# Patient Record
Sex: Female | Born: 1975
Health system: Southern US, Community
[De-identification: ages and names within clinical notes are randomized; demographics above are authoritative.]

## PROBLEM LIST (undated history)

## (undated) DIAGNOSIS — N760 Acute vaginitis: Secondary | ICD-10-CM

## (undated) DIAGNOSIS — B379 Candidiasis, unspecified: Secondary | ICD-10-CM

## (undated) DIAGNOSIS — J984 Other disorders of lung: Secondary | ICD-10-CM

## (undated) DIAGNOSIS — K219 Gastro-esophageal reflux disease without esophagitis: Secondary | ICD-10-CM

## (undated) DIAGNOSIS — J45909 Unspecified asthma, uncomplicated: Secondary | ICD-10-CM

## (undated) DIAGNOSIS — B9689 Other specified bacterial agents as the cause of diseases classified elsewhere: Secondary | ICD-10-CM

---

## 2000-02-16 ENCOUNTER — Inpatient Hospital Stay (HOSPITAL_COMMUNITY): Admission: EM | Admit: 2000-02-16 | Discharge: 2000-02-20 | Payer: Self-pay | Admitting: Emergency Medicine

## 2000-02-16 ENCOUNTER — Encounter: Payer: Self-pay | Admitting: Internal Medicine

## 2000-02-17 ENCOUNTER — Encounter: Payer: Self-pay | Admitting: Internal Medicine

## 2001-06-21 ENCOUNTER — Other Ambulatory Visit: Admission: RE | Admit: 2001-06-21 | Discharge: 2001-06-21 | Payer: Self-pay

## 2002-06-12 ENCOUNTER — Encounter: Admission: RE | Admit: 2002-06-12 | Discharge: 2002-06-12 | Payer: Self-pay | Admitting: *Deleted

## 2002-06-12 ENCOUNTER — Encounter: Payer: Self-pay | Admitting: Allergy and Immunology

## 2002-07-28 ENCOUNTER — Inpatient Hospital Stay (HOSPITAL_COMMUNITY): Admission: EM | Admit: 2002-07-28 | Discharge: 2002-08-06 | Payer: Self-pay | Admitting: *Deleted

## 2002-07-28 ENCOUNTER — Encounter: Payer: Self-pay | Admitting: Emergency Medicine

## 2002-07-29 ENCOUNTER — Encounter: Payer: Self-pay | Admitting: Internal Medicine

## 2002-07-30 ENCOUNTER — Encounter: Payer: Self-pay | Admitting: Internal Medicine

## 2002-07-31 ENCOUNTER — Encounter: Payer: Self-pay | Admitting: Internal Medicine

## 2002-08-01 ENCOUNTER — Encounter: Payer: Self-pay | Admitting: Internal Medicine

## 2002-08-02 ENCOUNTER — Encounter: Payer: Self-pay | Admitting: Internal Medicine

## 2002-08-03 ENCOUNTER — Encounter: Payer: Self-pay | Admitting: Internal Medicine

## 2002-08-04 ENCOUNTER — Encounter: Payer: Self-pay | Admitting: Internal Medicine

## 2002-08-05 ENCOUNTER — Encounter: Payer: Self-pay | Admitting: Internal Medicine

## 2002-08-13 ENCOUNTER — Ambulatory Visit (HOSPITAL_COMMUNITY): Admission: RE | Admit: 2002-08-13 | Discharge: 2002-08-13 | Payer: Self-pay | Admitting: Family Medicine

## 2002-08-13 ENCOUNTER — Encounter: Payer: Self-pay | Admitting: Family Medicine

## 2002-09-02 ENCOUNTER — Encounter: Admission: RE | Admit: 2002-09-02 | Discharge: 2002-09-02 | Payer: Self-pay | Admitting: Sports Medicine

## 2002-10-18 ENCOUNTER — Emergency Department (HOSPITAL_COMMUNITY): Admission: EM | Admit: 2002-10-18 | Discharge: 2002-10-18 | Payer: Self-pay | Admitting: Emergency Medicine

## 2002-10-18 ENCOUNTER — Encounter: Payer: Self-pay | Admitting: Emergency Medicine

## 2002-12-07 ENCOUNTER — Encounter (INDEPENDENT_AMBULATORY_CARE_PROVIDER_SITE_OTHER): Payer: Self-pay | Admitting: *Deleted

## 2002-12-07 LAB — CONVERTED CEMR LAB

## 2002-12-26 ENCOUNTER — Encounter: Admission: RE | Admit: 2002-12-26 | Discharge: 2002-12-26 | Payer: Self-pay | Admitting: Sports Medicine

## 2003-01-22 ENCOUNTER — Encounter: Admission: RE | Admit: 2003-01-22 | Discharge: 2003-01-22 | Payer: Self-pay | Admitting: Family Medicine

## 2003-08-24 ENCOUNTER — Emergency Department (HOSPITAL_COMMUNITY): Admission: EM | Admit: 2003-08-24 | Discharge: 2003-08-24 | Payer: Self-pay | Admitting: Family Medicine

## 2003-10-02 ENCOUNTER — Emergency Department (HOSPITAL_COMMUNITY): Admission: EM | Admit: 2003-10-02 | Discharge: 2003-10-02 | Payer: Self-pay | Admitting: Emergency Medicine

## 2003-10-19 ENCOUNTER — Emergency Department (HOSPITAL_COMMUNITY): Admission: EM | Admit: 2003-10-19 | Discharge: 2003-10-19 | Payer: Self-pay | Admitting: Family Medicine

## 2003-12-18 ENCOUNTER — Encounter: Admission: RE | Admit: 2003-12-18 | Discharge: 2003-12-18 | Payer: Self-pay | Admitting: Family Medicine

## 2004-01-08 ENCOUNTER — Ambulatory Visit: Payer: Self-pay | Admitting: Family Medicine

## 2004-02-19 ENCOUNTER — Ambulatory Visit: Payer: Self-pay | Admitting: Family Medicine

## 2004-03-17 ENCOUNTER — Ambulatory Visit: Payer: Self-pay | Admitting: Obstetrics and Gynecology

## 2006-03-30 ENCOUNTER — Emergency Department (HOSPITAL_COMMUNITY): Admission: EM | Admit: 2006-03-30 | Discharge: 2006-03-30 | Payer: Self-pay | Admitting: Emergency Medicine

## 2006-07-05 DIAGNOSIS — J45909 Unspecified asthma, uncomplicated: Secondary | ICD-10-CM | POA: Insufficient documentation

## 2006-07-05 DIAGNOSIS — K219 Gastro-esophageal reflux disease without esophagitis: Secondary | ICD-10-CM | POA: Insufficient documentation

## 2006-07-05 DIAGNOSIS — D509 Iron deficiency anemia, unspecified: Secondary | ICD-10-CM | POA: Insufficient documentation

## 2006-07-06 ENCOUNTER — Encounter (INDEPENDENT_AMBULATORY_CARE_PROVIDER_SITE_OTHER): Payer: Self-pay | Admitting: *Deleted

## 2006-07-30 ENCOUNTER — Emergency Department (HOSPITAL_COMMUNITY): Admission: EM | Admit: 2006-07-30 | Discharge: 2006-07-30 | Payer: Self-pay | Admitting: Family Medicine

## 2014-02-18 ENCOUNTER — Encounter (HOSPITAL_COMMUNITY): Payer: Self-pay | Admitting: Emergency Medicine

## 2014-02-18 ENCOUNTER — Emergency Department (INDEPENDENT_AMBULATORY_CARE_PROVIDER_SITE_OTHER)
Admission: EM | Admit: 2014-02-18 | Discharge: 2014-02-18 | Disposition: A | Discharge status: Home / Self Care | Payer: Medicare PPO | Source: Home / Self Care

## 2014-02-18 DIAGNOSIS — J309 Allergic rhinitis, unspecified: Secondary | ICD-10-CM

## 2014-02-18 DIAGNOSIS — K219 Gastro-esophageal reflux disease without esophagitis: Secondary | ICD-10-CM

## 2014-02-18 DIAGNOSIS — R05 Cough: Secondary | ICD-10-CM

## 2014-02-18 DIAGNOSIS — J3089 Other allergic rhinitis: Secondary | ICD-10-CM

## 2014-02-18 DIAGNOSIS — R059 Cough, unspecified: Secondary | ICD-10-CM

## 2014-02-18 DIAGNOSIS — J9809 Other diseases of bronchus, not elsewhere classified: Secondary | ICD-10-CM

## 2014-02-18 MED ORDER — SUCRALFATE 1 GM/10ML PO SUSP: ORAL | Status: DC

## 2014-02-18 NOTE — ED Provider Notes (Signed)
CSN: [id number]     Arrival date & time 02/18/14  1543 History   First MD Initiated Contact with Patient 02/18/14 1626     Chief Complaint  Patient presents with  . Sinusitis  . Gastrophageal Reflux   (Consider location/radiation/quality/duration/timing/severity/associated sxs/prior Treatment) HPI Comments: 38 year old female states that last p.m. she suffered an exacerbation of her esophageal reflux. She experienced has had reflux seem to the throat associated with local burning in the oral pharynx and esophagus. It is worse with a supine position. She has been sleeping in name Graciela HusbandsKlein position. She has been taking omeprazole 20 mg has a prescription for Nexium at that pharmacy. She has never seen a gastroenterologist.  A second complaint is that of sinus congestion with facial and forehead pressure. This began approximately a week ago. She is not taking medications for this.   History reviewed. No pertinent past medical history. History reviewed. No pertinent past surgical history. No family history on file. History  Substance Use Topics  . Smoking status: Never Smoker   . Smokeless tobacco: Not on file  . Alcohol Use: No   OB History   Grav Para Term Preterm Abortions TAB SAB Ect Mult Living                 Review of Systems  Constitutional: Negative for fever, activity change and fatigue.  HENT: Positive for congestion and postnasal drip. Negative for ear pain, sore throat and trouble swallowing.   Respiratory: Positive for cough. Negative for shortness of breath and wheezing.   Cardiovascular: Negative for chest pain.  Gastrointestinal: Negative for nausea, vomiting and abdominal pain.       As per history of present illness  Genitourinary: Negative.   Musculoskeletal: Negative for back pain and myalgias.  Skin: Negative for pallor and rash.  Neurological: Positive for headaches. Negative for tremors, syncope, speech difficulty and numbness.  Psychiatric/Behavioral:  Negative.     Allergies  Aspirin and Morphine and related  Home Medications   Prior to Admission medications   Medication Sig Start Date End Date Taking? Authorizing Provider  albuterol (PROVENTIL HFA;VENTOLIN HFA) 108 (90 BASE) MCG/ACT inhaler Inhale into the lungs every 6 (six) hours as needed for wheezing or shortness of breath.   Yes Historical Provider, MD  budesonide-formoterol (SYMBICORT) 160-4.5 MCG/ACT inhaler Inhale 2 puffs into the lungs 2 (two) times daily.   Yes Historical Provider, MD  esomeprazole (NEXIUM) 20 MG capsule Take 20 mg by mouth daily at 12 noon.   Yes Historical Provider, MD  sucralfate (CARAFATE) 1 GM/10ML suspension 10 mL before meals and before bedtime prn burning and reflux 02/18/14   Hayden Rasmussenavid Marvelle Span, NP   BP 112/71  Pulse 72  Temp(Src) 98.5 F (36.9 C) (Oral)  Resp 18  SpO2 99%  LMP 02/18/2014 Physical Exam  Nursing note and vitals reviewed. Constitutional: She is oriented to person, place, and time. She appears well-developed and well-nourished. No distress.  HENT:  Mouth/Throat: No oropharyngeal exudate.  TM's normal OP with minor erythema and clear PND  Eyes: Conjunctivae and EOM are normal.  Neck: Normal range of motion. Neck supple.  Cardiovascular: Normal rate, regular rhythm and normal heart sounds.   Pulmonary/Chest: Effort normal. She has wheezes. She has no rales.  Musculoskeletal: Normal range of motion. She exhibits no edema.  Lymphadenopathy:    She has no cervical adenopathy.  Neurological: She is alert and oriented to person, place, and time. She exhibits normal muscle tone.  Skin: Skin is warm  and dry.  Psychiatric: She has a normal mood and affect.    ED Course  Procedures (including critical care time) Labs Review Labs Reviewed - No data to display  Imaging Review No results found.   MDM   1. Gastroesophageal reflux disease, esophagitis presence not specified   2. Cough   3. Recurrent bronchospasm   4. Allergic  sinusitis   5. Other allergic rhinitis     Contributions to cough include GERD, bronchospasm and PND Zyrtec or allegra, Flonase and saline nasal spray Use your albuterol HFA again for wheeze Start Nexium total 40 mg a day and Pepcid 20 mg before HS Carafate as directed F/U with PCP, may need to see GI and have upper endo.     Hayden Rasmussenavid Valincia Touch, NP 02/18/14 [phone]

## 2014-02-18 NOTE — Discharge Instructions (Signed)
Allergic Rhinitis Flonase nasal spray Lots of saline nasal spray and frequently Sudafed PE 10 mg for sinus congestion  Allergic rhinitis is when the mucous membranes in the nose respond to allergens. Allergens are particles in the air that cause your body to have an allergic reaction. This causes you to release allergic antibodies. Through a chain of events, these eventually cause you to release histamine into the blood stream. Although meant to protect the body, it is this release of histamine that causes your discomfort, such as frequent sneezing, congestion, and an itchy, runny nose.  CAUSES  Seasonal allergic rhinitis (hay fever) is caused by pollen allergens that may come from grasses, trees, and weeds. Year-round allergic rhinitis (perennial allergic rhinitis) is caused by allergens such as house dust mites, pet dander, and mold spores.  SYMPTOMS   Nasal stuffiness (congestion).  Itchy, runny nose with sneezing and tearing of the eyes. DIAGNOSIS  Your health care provider can help you determine the allergen or allergens that trigger your symptoms. If you and your health care provider are unable to determine the allergen, skin or blood testing may be used. TREATMENT  Allergic rhinitis does not have a cure, but it can be controlled by:  Medicines and allergy shots (immunotherapy).  Avoiding the allergen. Hay fever may often be treated with antihistamines in pill or nasal spray forms. Antihistamines block the effects of histamine. There are over-the-counter medicines that may help with nasal congestion and swelling around the eyes. Check with your health care provider before taking or giving this medicine.  If avoiding the allergen or the medicine prescribed do not work, there are many new medicines your health care provider can prescribe. Stronger medicine may be used if initial measures are ineffective. Desensitizing injections can be used if medicine and avoidance does not work.  Desensitization is when a patient is given ongoing shots until the body becomes less sensitive to the allergen. Make sure you follow up with your health care provider if problems continue. HOME CARE INSTRUCTIONS It is not possible to completely avoid allergens, but you can reduce your symptoms by taking steps to limit your exposure to them. It helps to know exactly what you are allergic to so that you can avoid your specific triggers. SEEK MEDICAL CARE IF:   You have a fever.  You develop a cough that does not stop easily (persistent).  You have shortness of breath.  You start wheezing.  Symptoms interfere with normal daily activities. Document Released: 01/17/2001 Document Revised: 04/29/2013 Document Reviewed: 12/30/2012 [facility] Patient Information 2015 [place], [place]. This information is not intended to replace advice given to you by your health care provider. Make sure you discuss any questions you have with your health care provider.  Barrett's Esophagus Barrett's esophagus occurs when the lining of the esophagus is damaged. The esophagus is the tube that carries food from the mouth to the stomach. With Barrett's esophagus, the lining of the esophagus gets replaced by material that is similar to the lining in the intestines. This process is called intestinal metaplasia. A small number of people with Barrett's esophagus develop esophageal cancer. CAUSES  The exact cause of Barrett's esophagus is unknown. SYMPTOMS  Most people with Barrett's esophagus do not have symptoms. However, many patients also have gastroesophageal reflux disease (GERD). GERD can cause heartburn, trouble swallowing, and a dry cough. DIAGNOSIS Barrett's esophagus is diagnosed by an exam called upper gastrointestinal endoscopy. A thin, flexible tube (endoscope) is passed down the esophagus. The endoscope has  a light and camera on the end. Your caregiver uses the endoscope to view the inside of the esophagus. A  tissue sample may also be taken and examined under a microscope (biopsy). If cancer cells are found during the biopsy, this condition is called dysplasia. TREATMENT  If you have no dysplasia or low-grade dysplasia, your caregiver may recommend no treatment or only taking medicines to treat GERD. Sometimes, taking acid-blocking drugs to treat GERD helps improve the tissue affected by Barrett's esophagus. Your caregiver may also recommend periodic esophageal exams. If you have high-grade dysplasia, treatment may include removing the damaged parts of the esophagus. This can be done by heating, freezing, or surgically removing the tissue. In some cases, surgery may be done to remove most of the esophagus. The stomach is then attached to the remaining portion of the esophagus. HOME CARE INSTRUCTIONS  Take acid-blocking drugs for GERD if recommended by your caregiver.  Keep all follow-up appointments as directed by your caregiver. You may need periodic esophageal exams. SEEK IMMEDIATE MEDICAL CARE IF:  You have chest pain.  You have trouble swallowing.  You vomit blood or material that looks like coffee grounds.  Your stools are bright red or dark. Document Released: 07/15/2003 Document Revised: 10/24/2011 Document Reviewed: 07/04/2011 [facility] Patient Information 2015 [place], [place]. This information is not intended to replace advice given to you by your health care provider. Make sure you discuss any questions you have with your health care provider.  Gastroesophageal Reflux Disease, Adult Nexium 40 mg a day Add Pecid 20 mg 1 hour before bedtime Carafate as directed t coat stomach Gastroesophageal reflux disease (GERD) happens when acid from your stomach flows up into the esophagus. When acid comes in contact with the esophagus, the acid causes soreness (inflammation) in the esophagus. Over time, GERD may create small holes (ulcers) in the lining of the esophagus. CAUSES   Increased body  weight. This puts pressure on the stomach, making acid rise from the stomach into the esophagus.  Smoking. This increases acid production in the stomach.  Drinking alcohol. This causes decreased pressure in the lower esophageal sphincter (valve or ring of muscle between the esophagus and stomach), allowing acid from the stomach into the esophagus.  Late evening meals and a full stomach. This increases pressure and acid production in the stomach.  A malformed lower esophageal sphincter. Sometimes, no cause is found. SYMPTOMS   Burning pain in the lower part of the mid-chest behind the breastbone and in the mid-stomach area. This may occur twice a week or more often.  Trouble swallowing.  Sore throat.  Dry cough.  Asthma-like symptoms including chest tightness, shortness of breath, or wheezing. DIAGNOSIS  Your caregiver may be able to diagnose GERD based on your symptoms. In some cases, X-rays and other tests may be done to check for complications or to check the condition of your stomach and esophagus. TREATMENT  Your caregiver may recommend over-the-counter or prescription medicines to help decrease acid production. Ask your caregiver before starting or adding any new medicines.  HOME CARE INSTRUCTIONS   Change the factors that you can control. Ask your caregiver for guidance concerning weight loss, quitting smoking, and alcohol consumption.  Avoid foods and drinks that make your symptoms worse, such as:  Caffeine or alcoholic drinks.  Chocolate.  Peppermint or mint flavorings.  Garlic and onions.  Spicy foods.  Citrus fruits, such as oranges, lemons, or limes.  Tomato-based foods such as sauce, chili, salsa, and pizza.  Foy [place]  and fatty foods.  Avoid lying down for the 3 hours prior to your bedtime or prior to taking a nap.  Eat small, frequent meals instead of large meals.  Wear loose-fitting clothing. Do not wear anything tight around your waist that causes  pressure on your stomach.  Raise the head of your bed 6 to 8 inches with wood blocks to help you sleep. Extra pillows will not help.  Only take over-the-counter or prescription medicines for pain, discomfort, or fever as directed by your caregiver.  Do not take aspirin, ibuprofen, or other nonsteroidal anti-inflammatory drugs (NSAIDs). SEEK IMMEDIATE MEDICAL CARE IF:   You have pain in your arms, neck, jaw, teeth, or back.  Your pain increases or changes in intensity or duration.  You develop nausea, vomiting, or sweating (diaphoresis).  You develop shortness of breath, or you faint.  Your vomit is green, yellow, black, or looks like coffee grounds or blood.  Your stool is red, bloody, or black. These symptoms could be signs of other problems, such as heart disease, gastric bleeding, or esophageal bleeding. MAKE SURE YOU:   Understand these instructions.  Will watch your condition.  Will get help right away if you are not doing well or get worse. Document Released: 02/01/2005 Document Revised: 07/17/2011 Document Reviewed: 11/11/2010 [facility] Patient Information 2015 [place], [place]. This information is not intended to replace advice given to you by your health care provider. Make sure you discuss any questions you have with your health care provider.  Food Choices for Gastroesophageal Reflux Disease When you have gastroesophageal reflux disease (GERD), the foods you eat and your eating habits are very important. Choosing the right foods can help ease the discomfort of GERD. WHAT GENERAL GUIDELINES DO I NEED TO FOLLOW?  Choose fruits, vegetables, whole grains, low-fat dairy products, and low-fat meat, fish, and poultry.  Limit fats such as oils, salad dressings, butter, nuts, and avocado.  Keep a food diary to identify foods that cause symptoms.  Avoid foods that cause reflux. These may be different for different people.  Eat frequent small meals instead of three large  meals each day.  Eat your meals slowly, in a relaxed setting.  Limit fried foods.  Cook foods using methods other than frying.  Avoid drinking alcohol.  Avoid drinking large amounts of liquids with your meals.  Avoid bending over or lying down until 2-3 hours after eating. WHAT FOODS ARE NOT RECOMMENDED? The following are some foods and drinks that may worsen your symptoms: Vegetables Tomatoes. Tomato juice. Tomato and spaghetti sauce. Chili peppers. Onion and garlic. Horseradish. Fruits Oranges, grapefruit, and lemon (fruit and juice). Meats High-fat meats, fish, and poultry. This includes hot dogs, ribs, ham, sausage, salami, and bacon. Dairy Whole milk and chocolate milk. Sour cream. Cream. Butter. Ice cream. Cream cheese.  Beverages Coffee and tea, with or without caffeine. Carbonated beverages or energy drinks. Condiments Hot sauce. Barbecue sauce.  Sweets/Desserts Chocolate and cocoa. Donuts. Peppermint and spearmint. Fats and Oils High-fat foods, including [place] fries and potato chips. Other Vinegar. Strong spices, such as black pepper, white pepper, red pepper, cayenne, curry powder, cloves, ginger, and chili powder. The items listed above may not be a complete list of foods and beverages to avoid. Contact your dietitian for more information. Document Released: 04/24/2005 Document Revised: 04/29/2013 Document Reviewed: 02/26/2013 [facility] Patient Information 2015 [place], [place]. This information is not intended to replace advice given to you by your health care provider. Make sure you discuss any questions you  have with your health care provider.  Sinusitis Sinusitis is redness, soreness, and puffiness (inflammation) of the air pockets in the bones of your face (sinuses). The redness, soreness, and puffiness can cause air and mucus to get trapped in your sinuses. This can allow germs to grow and cause an infection.  HOME CARE   Drink enough fluids to keep your pee  (urine) clear or pale yellow.  Use a humidifier in your home.  Run a hot shower to create steam in the bathroom. Sit in the bathroom with the door closed. Breathe in the steam 3-4 times a day.  Put a warm, moist washcloth on your face 3-4 times a day, or as told by your doctor.  Use salt water sprays (saline sprays) to wet the thick fluid in your nose. This can help the sinuses drain.  Only take medicine as told by your doctor. GET HELP RIGHT AWAY IF:   Your pain gets worse.  You have very bad headaches.  You are sick to your stomach (nauseous).  You throw up (vomit).  You are very sleepy (drowsy) all the time.  Your face is puffy (swollen).  Your vision changes.  You have a stiff neck.  You have trouble breathing. MAKE SURE YOU:   Understand these instructions.  Will watch your condition.  Will get help right away if you are not doing well or get worse. Document Released: 10/11/2007 Document Revised: 01/17/2012 Document Reviewed: 11/28/2011 [facility] Patient Information 2015 [place], [place]. This information is not intended to replace advice given to you by your health care provider. Make sure you discuss any questions you have with your health care provider.

## 2014-02-18 NOTE — ED Provider Notes (Signed)
Medical screening examination/treatment/procedure(s) were performed by non-physician practitioner and as supervising physician I was immediately available for consultation/collaboration.  Leslee Homeavid Tanmay Halteman, M.D.  Reuben Likesavid C Melida Northington, MD 02/18/14 [phone]

## 2014-02-18 NOTE — ED Notes (Signed)
C/o sinus sx onset 1 week Sx include: facial pressure, PND, runny nose, HA Denies f/v/n/d Also c/o "extreme indigestion" onset 2 days Taking omeprazole w/no relief Alert, no signs of acute distress.

## 2014-10-14 ENCOUNTER — Other Ambulatory Visit: Payer: Self-pay | Admitting: Obstetrics and Gynecology

## 2014-10-15 LAB — CYTOLOGY - PAP

## 2014-12-11 DIAGNOSIS — Q336 Congenital hypoplasia and dysplasia of lung: Secondary | ICD-10-CM

## 2014-12-11 DIAGNOSIS — Q2579 Other congenital malformations of pulmonary artery: Secondary | ICD-10-CM

## 2015-06-25 DIAGNOSIS — J45909 Unspecified asthma, uncomplicated: Secondary | ICD-10-CM | POA: Diagnosis not present

## 2015-06-25 DIAGNOSIS — Z6841 Body Mass Index (BMI) 40.0 and over, adult: Secondary | ICD-10-CM | POA: Diagnosis not present

## 2015-06-25 DIAGNOSIS — J4551 Severe persistent asthma with (acute) exacerbation: Secondary | ICD-10-CM | POA: Diagnosis not present

## 2015-06-25 DIAGNOSIS — K219 Gastro-esophageal reflux disease without esophagitis: Secondary | ICD-10-CM | POA: Diagnosis not present

## 2015-06-25 DIAGNOSIS — J449 Chronic obstructive pulmonary disease, unspecified: Secondary | ICD-10-CM | POA: Diagnosis not present

## 2015-07-23 DIAGNOSIS — R69 Illness, unspecified: Secondary | ICD-10-CM | POA: Diagnosis not present

## 2015-07-26 DIAGNOSIS — R69 Illness, unspecified: Secondary | ICD-10-CM | POA: Diagnosis not present

## 2015-09-21 DIAGNOSIS — H5210 Myopia, unspecified eye: Secondary | ICD-10-CM | POA: Diagnosis not present

## 2015-11-10 DIAGNOSIS — R69 Illness, unspecified: Secondary | ICD-10-CM | POA: Diagnosis not present

## 2015-12-09 DIAGNOSIS — R69 Illness, unspecified: Secondary | ICD-10-CM | POA: Diagnosis not present

## 2015-12-31 DIAGNOSIS — E65 Localized adiposity: Secondary | ICD-10-CM | POA: Diagnosis not present

## 2015-12-31 DIAGNOSIS — J301 Allergic rhinitis due to pollen: Secondary | ICD-10-CM | POA: Diagnosis not present

## 2015-12-31 DIAGNOSIS — Q333 Agenesis of lung: Secondary | ICD-10-CM | POA: Diagnosis not present

## 2015-12-31 DIAGNOSIS — J449 Chronic obstructive pulmonary disease, unspecified: Secondary | ICD-10-CM | POA: Diagnosis not present

## 2015-12-31 DIAGNOSIS — J4550 Severe persistent asthma, uncomplicated: Secondary | ICD-10-CM | POA: Diagnosis not present

## 2016-05-03 DIAGNOSIS — J4551 Severe persistent asthma with (acute) exacerbation: Secondary | ICD-10-CM | POA: Diagnosis not present

## 2016-05-31 ENCOUNTER — Encounter (HOSPITAL_COMMUNITY): Payer: Self-pay | Admitting: Emergency Medicine

## 2016-05-31 ENCOUNTER — Ambulatory Visit (HOSPITAL_COMMUNITY)
Admission: EM | Admit: 2016-05-31 | Discharge: 2016-05-31 | Disposition: A | Discharge status: Home / Self Care | Payer: Medicare HMO | Attending: Emergency Medicine | Admitting: Emergency Medicine

## 2016-05-31 DIAGNOSIS — R0602 Shortness of breath: Secondary | ICD-10-CM | POA: Diagnosis not present

## 2016-05-31 DIAGNOSIS — J4541 Moderate persistent asthma with (acute) exacerbation: Secondary | ICD-10-CM | POA: Diagnosis not present

## 2016-05-31 DIAGNOSIS — J40 Bronchitis, not specified as acute or chronic: Secondary | ICD-10-CM | POA: Diagnosis not present

## 2016-05-31 HISTORY — DX: Unspecified asthma, uncomplicated: J45.909

## 2016-05-31 HISTORY — DX: Gastro-esophageal reflux disease without esophagitis: K21.9

## 2016-05-31 HISTORY — DX: Other disorders of lung: J98.4

## 2016-05-31 MED ORDER — ALBUTEROL SULFATE HFA 108 (90 BASE) MCG/ACT IN AERS
1.0000 | INHALATION_SPRAY | Freq: Four times a day (QID) | RESPIRATORY_TRACT | 0 refills | Status: DC | PRN
Start: 2016-05-31 — End: 2017-02-26

## 2016-05-31 MED ORDER — ALBUTEROL SULFATE (2.5 MG/3ML) 0.083% IN NEBU
2.5000 mg | INHALATION_SOLUTION | Freq: Once | RESPIRATORY_TRACT | Status: AC
  Administered 2016-05-31: 2.5 mg via RESPIRATORY_TRACT

## 2016-05-31 MED ORDER — BENZONATATE 100 MG PO CAPS: 200.0000 mg | ORAL_CAPSULE | Freq: Three times a day (TID) | ORAL | 0 refills | Status: DC | PRN

## 2016-05-31 MED ORDER — ALBUTEROL SULFATE (2.5 MG/3ML) 0.083% IN NEBU
INHALATION_SOLUTION | RESPIRATORY_TRACT | Status: AC
  Filled 2016-05-31: qty 3

## 2016-05-31 MED ORDER — AZITHROMYCIN 250 MG PO TABS: 250.0000 mg | ORAL_TABLET | Freq: Every day | ORAL | 0 refills | Status: DC

## 2016-05-31 MED ORDER — PREDNISONE 10 MG (21) PO TBPK: 10.0000 mg | ORAL_TABLET | Freq: Every day | ORAL | 0 refills | Status: DC

## 2016-05-31 NOTE — ED Provider Notes (Signed)
CSN: [id number]     Arrival date & time 05/31/16  1058 History   First MD Initiated Contact with Patient 05/31/16 1130     Chief Complaint  Patient presents with  . Shortness of Breath   (Consider location/radiation/quality/duration/timing/severity/associated sxs/prior Treatment) Patient c/o SOB, sinus congestion, coughing and fever.  Patient c/o sx's for 4 days.   The history is provided by the patient.  Shortness of Breath  Severity:  Moderate Onset quality:  Sudden Duration:  4 days Timing:  Constant Progression:  Worsening Chronicity:  New Context: activity, URI and weather changes   Relieved by:  Inhaler Worsened by:  Activity Ineffective treatments:  Rest Associated symptoms: wheezing     Past Medical History:  Diagnosis Date  . Asthma   . GERD (gastroesophageal reflux disease)   . Lung abnormality    "right lung underdeveloped"   History reviewed. No pertinent surgical history. No family history on file. Social History  Substance Use Topics  . Smoking status: Never Smoker  . Smokeless tobacco: Not on file  . Alcohol use No   OB History    No data available     Review of Systems  Constitutional: Positive for fatigue.  HENT: Positive for rhinorrhea.   Eyes: Negative.   Respiratory: Positive for shortness of breath and wheezing.   Cardiovascular: Negative.   Gastrointestinal: Negative.   Endocrine: Negative.   Genitourinary: Negative.   Musculoskeletal: Negative.   Skin: Negative.   Allergic/Immunologic: Negative.   Neurological: Negative.   Hematological: Negative.   Psychiatric/Behavioral: Negative.     Allergies  Aspirin and Morphine and related  Home Medications   Prior to Admission medications   Medication Sig Start Date End Date Taking? Authorizing Provider  acetaminophen (TYLENOL) 325 MG tablet Take 650 mg by mouth every 6 (six) hours as needed.   Yes Historical Provider, MD  albuterol (PROVENTIL HFA;VENTOLIN HFA) 108 (90 BASE) MCG/ACT  inhaler Inhale into the lungs every 6 (six) hours as needed for wheezing or shortness of breath.    Historical Provider, MD  albuterol (PROVENTIL HFA;VENTOLIN HFA) 108 (90 Base) MCG/ACT inhaler Inhale 1-2 puffs into the lungs every 6 (six) hours as needed for wheezing or shortness of breath. 05/31/16   Deatra CanterWilliam J Dally Oshel, FNP  azithromycin (ZITHROMAX) 250 MG tablet Take 1 tablet (250 mg total) by mouth daily. Take first 2 tablets together, then 1 every day until finished. 05/31/16   Deatra CanterWilliam J Chima Astorino, FNP  benzonatate (TESSALON) 100 MG capsule Take 2 capsules (200 mg total) by mouth 3 (three) times daily as needed for cough. 05/31/16   Deatra CanterWilliam J Ayari Liwanag, FNP  budesonide-formoterol (SYMBICORT) 160-4.5 MCG/ACT inhaler Inhale 2 puffs into the lungs 2 (two) times daily.    Historical Provider, MD  esomeprazole (NEXIUM) 20 MG capsule Take 20 mg by mouth daily at 12 noon.    Historical Provider, MD  predniSONE (STERAPRED UNI-PAK 21 TAB) 10 MG (21) TBPK tablet Take 1 tablet (10 mg total) by mouth daily. Take 4 po qd x2d then 3po qd x 2d then 2po qd x 2d then 1po qd x 2d then stop 05/31/16   Deatra CanterWilliam J Jaclyn Andy, FNP  sucralfate (CARAFATE) 1 GM/10ML suspension 10 mL before meals and before bedtime prn burning and reflux 02/18/14   Hayden Rasmussenavid Mabe, NP   Meds Ordered and Administered this Visit   Medications  albuterol (PROVENTIL) (2.5 MG/3ML) 0.083% nebulizer solution 2.5 mg (2.5 mg Nebulization Given 05/31/16 1143)    BP 116/81 (BP Location: Right  Arm) Comment (BP Location): large cuff  Pulse 103   Temp 98.4 F (36.9 C) (Oral)   Resp 22   LMP 04/12/2016   SpO2 97%  No data found.   Physical Exam  Constitutional: She is oriented to person, place, and time. She appears well-developed and well-nourished.  HENT:  Head: Normocephalic and atraumatic.  Right Ear: External ear normal.  Left Ear: External ear normal.  Mouth/Throat: Oropharynx is clear and moist.  Eyes: Conjunctivae and EOM are normal. Pupils are equal,  round, and reactive to light.  Neck: Normal range of motion. Neck supple.  Cardiovascular: Normal rate, regular rhythm and normal heart sounds.   Pulmonary/Chest: Effort normal. She has wheezes.  Abdominal: Soft. Bowel sounds are normal.  Musculoskeletal: Normal range of motion.  Neurological: She is alert and oriented to person, place, and time.  Nursing note and vitals reviewed.   Urgent Care Course     Procedures (including critical care time)  Labs Review Labs Reviewed - No data to display  Imaging Review No results found.   Visual Acuity Review  Right Eye Distance:   Left Eye Distance:   Bilateral Distance:    Right Eye Near:   Left Eye Near:    Bilateral Near:         MDM   1. Moderate persistent asthma with exacerbation   2. Bronchitis   3. SOB (shortness of breath)    Neb Treatment Zpak Prednisone taper 10mg  4x2 3x2 2x2 1x2 then stop#20 Albuterol MDI   Push po fluids, rest, tylenol and motrin otc prn as directed for fever, arthralgias, and myalgias.  Follow up prn if sx's continue or persist.    Deatra Canter, FNP 05/31/16 1201

## 2016-05-31 NOTE — ED Triage Notes (Signed)
Sob and sinus congestion, runny nose, coughing.  Coughing up dark yellow phlegm.  Sob with activity and sob with coughing spells.  Unsure if fever.  Having chills and hot spells and feeling very tired

## 2016-06-01 DIAGNOSIS — R69 Illness, unspecified: Secondary | ICD-10-CM | POA: Diagnosis not present

## 2016-06-03 ENCOUNTER — Encounter (HOSPITAL_COMMUNITY): Payer: Self-pay | Admitting: Emergency Medicine

## 2016-06-03 ENCOUNTER — Emergency Department (HOSPITAL_COMMUNITY)
Admission: EM | Admit: 2016-06-03 | Discharge: 2016-06-03 | Disposition: A | Discharge status: Home / Self Care | Payer: Medicare HMO | Attending: Emergency Medicine | Admitting: Emergency Medicine

## 2016-06-03 ENCOUNTER — Emergency Department (HOSPITAL_COMMUNITY): Payer: Medicare HMO

## 2016-06-03 DIAGNOSIS — J988 Other specified respiratory disorders: Secondary | ICD-10-CM | POA: Insufficient documentation

## 2016-06-03 DIAGNOSIS — Z79899 Other long term (current) drug therapy: Secondary | ICD-10-CM | POA: Diagnosis not present

## 2016-06-03 DIAGNOSIS — R0602 Shortness of breath: Secondary | ICD-10-CM | POA: Diagnosis not present

## 2016-06-03 DIAGNOSIS — J069 Acute upper respiratory infection, unspecified: Secondary | ICD-10-CM | POA: Diagnosis not present

## 2016-06-03 DIAGNOSIS — R69 Illness, unspecified: Secondary | ICD-10-CM | POA: Diagnosis not present

## 2016-06-03 DIAGNOSIS — J45909 Unspecified asthma, uncomplicated: Secondary | ICD-10-CM | POA: Insufficient documentation

## 2016-06-03 DIAGNOSIS — R05 Cough: Secondary | ICD-10-CM | POA: Diagnosis not present

## 2016-06-03 LAB — TROPONIN I: Troponin I: <0.03 ng/mL (ref <0.03)

## 2016-06-03 LAB — BASIC METABOLIC PANEL
Anion gap: 11 (ref 5–15)
BUN: 5 mg/dL — ABNORMAL LOW (ref 6–20)
CHLORIDE: 104 mmol/L (ref 101–111)
CO2: 25 mmol/L (ref 22–32)
Calcium: 9.3 mg/dL (ref 8.9–10.3)
Creatinine, Ser: 0.81 mg/dL (ref 0.44–1.00)
GFR calc Af Amer: >60 mL/min (ref >60)
GFR calc non Af Amer: >60 mL/min (ref >60)
GLUCOSE: 99 mg/dL (ref 65–99)
Potassium: 4.1 mmol/L (ref 3.5–5.1)
Sodium: 140 mmol/L (ref 135–145)

## 2016-06-03 LAB — CBC WITH DIFFERENTIAL/PLATELET
Basophils Absolute: 0 10*3/uL (ref 0.0–0.1)
Basophils Relative: 0 %
EOS PCT: 0 %
Eosinophils Absolute: 0 10*3/uL (ref 0.0–0.7)
HCT: 39.9 % (ref 36.0–46.0)
Hemoglobin: 12.8 g/dL (ref 12.0–15.0)
LYMPHS PCT: 26 %
Lymphs Abs: 1.3 10*3/uL (ref 0.7–4.0)
MCH: 24.6 pg — ABNORMAL LOW (ref 26.0–34.0)
MCHC: 32.1 g/dL (ref 30.0–36.0)
MCV: 76.6 fL — AB (ref 78.0–100.0)
MONO ABS: 0.5 10*3/uL (ref 0.1–1.0)
Monocytes Relative: 10 %
Neutro Abs: 3.2 10*3/uL (ref 1.7–7.7)
Neutrophils Relative %: 64 %
Platelets: 273 10*3/uL (ref 150–400)
RBC: 5.21 MIL/uL — ABNORMAL HIGH (ref 3.87–5.11)
RDW: 17.3 % — AB (ref 11.5–15.5)
WBC: 5.1 10*3/uL (ref 4.0–10.5)

## 2016-06-03 LAB — BRAIN NATRIURETIC PEPTIDE: B Natriuretic Peptide: 12.5 pg/mL (ref 0.0–100.0)

## 2016-06-03 LAB — D-DIMER, QUANTITATIVE: D-Dimer, Quant: <0.27 ug/mL-FEU (ref 0.00–0.50)

## 2016-06-03 MED ORDER — PREDNISONE 20 MG PO TABS: 60.0000 mg | ORAL_TABLET | Freq: Every day | ORAL | 0 refills | Status: DC

## 2016-06-03 MED ORDER — ALBUTEROL SULFATE (2.5 MG/3ML) 0.083% IN NEBU
INHALATION_SOLUTION | RESPIRATORY_TRACT | Status: AC
  Filled 2016-06-03: qty 3

## 2016-06-03 MED ORDER — IPRATROPIUM-ALBUTEROL 0.5-2.5 (3) MG/3ML IN SOLN
3.0000 mL | Freq: Once | RESPIRATORY_TRACT | Status: AC
  Administered 2016-06-03: 3 mL via RESPIRATORY_TRACT
  Filled 2016-06-03: qty 3

## 2016-06-03 MED ORDER — ALBUTEROL SULFATE (2.5 MG/3ML) 0.083% IN NEBU: 2.5000 mg | INHALATION_SOLUTION | Freq: Four times a day (QID) | RESPIRATORY_TRACT | 12 refills | Status: DC | PRN

## 2016-06-03 MED ORDER — ALBUTEROL SULFATE (2.5 MG/3ML) 0.083% IN NEBU
5.0000 mg | INHALATION_SOLUTION | Freq: Once | RESPIRATORY_TRACT | Status: AC
  Administered 2016-06-03: 5 mg via RESPIRATORY_TRACT

## 2016-06-03 MED ORDER — METHYLPREDNISOLONE SODIUM SUCC 125 MG IJ SOLR
125.0000 mg | Freq: Once | INTRAMUSCULAR | Status: AC
  Administered 2016-06-03: 125 mg via INTRAVENOUS
  Filled 2016-06-03: qty 2

## 2016-06-03 NOTE — ED Triage Notes (Signed)
Pt reports she has developed a cough and SOB since Tuesday, despite following Provider's instructions, (inhaliers, prednisone, antibiotics).  SOB when coughing or upon any activity.  Feels better but still experiencing SOB.

## 2016-06-03 NOTE — Discharge Instructions (Signed)
Continue using your inhaler and nebulizer. Take the new prescription for Prednisone. When it is finished, then resume the prescription you gat several days ago. Return if symptoms get worse.

## 2016-06-03 NOTE — ED Provider Notes (Signed)
[facility] Provider Note   CSN: [id number] Arrival date & time: 06/03/16  0248     History   Chief Complaint Chief Complaint  Patient presents with  . Shortness of Breath  . Cough    HPI Heather Harding is a 41 y.o. female.  She started getting sick 5 days ago with fever, chills, cough, body aches. She had some difficulty breathing which was felt to be related to her asthma. She was seen in urgent care 3 days ago and started on a prednisone taper and was given a course of azithromycin. Fever and cough and body aches have improved, but she continues to be dyspneic. She usually has dyspnea with slight exertion, but she is having dyspnea at rest. He denies chest pain, nausea, vomiting, diarrhea. She has a nebulizer at home which is only giving very slight relief. She denies any recent travel or surgery and she is not on oral contraceptives.   The history is provided by the patient.    Past Medical History:  Diagnosis Date  . Asthma   . GERD (gastroesophageal reflux disease)   . Lung abnormality    "right lung underdeveloped"    Patient Active Problem List   Diagnosis Date Noted  . ANEMIA, IRON DEFICIENCY, UNSPEC. 07/05/2006  . ASTHMA, UNSPECIFIED 07/05/2006  . GASTROESOPHAGEAL REFLUX, NO ESOPHAGITIS 07/05/2006    History reviewed. No pertinent surgical history.  OB History    No data available       Home Medications    Prior to Admission medications   Medication Sig Start Date End Date Taking? Authorizing Provider  acetaminophen (TYLENOL) 325 MG tablet Take 650 mg by mouth every 6 (six) hours as needed.    Historical Provider, MD  albuterol (PROVENTIL HFA;VENTOLIN HFA) 108 (90 BASE) MCG/ACT inhaler Inhale into the lungs every 6 (six) hours as needed for wheezing or shortness of breath.    Historical Provider, MD  albuterol (PROVENTIL HFA;VENTOLIN HFA) 108 (90 Base) MCG/ACT inhaler Inhale 1-2 puffs into the lungs every 6 (six) hours as needed for wheezing  or shortness of breath. 05/31/16   Deatra Canter, FNP  azithromycin (ZITHROMAX) 250 MG tablet Take 1 tablet (250 mg total) by mouth daily. Take first 2 tablets together, then 1 every day until finished. 05/31/16   Deatra Canter, FNP  benzonatate (TESSALON) 100 MG capsule Take 2 capsules (200 mg total) by mouth 3 (three) times daily as needed for cough. 05/31/16   Deatra Canter, FNP  budesonide-formoterol (SYMBICORT) 160-4.5 MCG/ACT inhaler Inhale 2 puffs into the lungs 2 (two) times daily.    Historical Provider, MD  esomeprazole (NEXIUM) 20 MG capsule Take 20 mg by mouth daily at 12 noon.    Historical Provider, MD  predniSONE (STERAPRED UNI-PAK 21 TAB) 10 MG (21) TBPK tablet Take 1 tablet (10 mg total) by mouth daily. Take 4 po qd x2d then 3po qd x 2d then 2po qd x 2d then 1po qd x 2d then stop 05/31/16   Deatra Canter, FNP  sucralfate (CARAFATE) 1 GM/10ML suspension 10 mL before meals and before bedtime prn burning and reflux 02/18/14   Hayden Rasmussen, NP    Family History No family history on file.  Social History Social History  Substance Use Topics  . Smoking status: Never Smoker  . Smokeless tobacco: Not on file  . Alcohol use No     Allergies   Aspirin and Morphine and related   Review of Systems Review of  Systems  All other systems reviewed and are negative.    Physical Exam Updated Vital Signs BP 151/93 (BP Location: Left Arm)   Pulse (!) 127   Temp 98.9 F (37.2 C) (Oral)   Resp 24   LMP 05/12/2016   SpO2 95%   Physical Exam  Nursing note and vitals reviewed.  41 year old female, resting comfortably and in no acute distress. Vital signs are Significant for hypertension, tachypnea, tachycardia. Oxygen saturation is 95%, which is normal. Head is normocephalic and atraumatic. PERRLA, EOMI. Oropharynx is clear. Neck is nontender and supple without adenopathy or JVD. Back is nontender and there is no CVA tenderness. Lungs are clear without rales, wheezes, or  rhonchi. Slight wheezing is noted with forced exhalation. Chest is nontender. Heart has regular rate and rhythm without murmur. Abdomen is soft, flat, nontender without masses or hepatosplenomegaly and peristalsis is normoactive. Extremities have trace edema, full range of motion is present. Skin is warm and dry without rash. Neurologic: Mental status is normal, cranial nerves are intact, there are no motor or sensory deficits.  ED Treatments / Results  Labs (all labs ordered are listed, but only abnormal results are displayed) Labs Reviewed  BASIC METABOLIC PANEL - Abnormal; Notable for the following:       Result Value   BUN 5 (*)    All other components within normal limits  CBC WITH DIFFERENTIAL/PLATELET - Abnormal; Notable for the following:    RBC 5.21 (*)    MCV 76.6 (*)    MCH 24.6 (*)    RDW 17.3 (*)    All other components within normal limits  D-DIMER, QUANTITATIVE (NOT AT [facility])  TROPONIN I  BRAIN NATRIURETIC PEPTIDE    EKG  EKG Interpretation  Date/Time:  Saturday June 03 2016 03:29:11 EST Ventricular Rate:  105 PR Interval:  128 QRS Duration: 106 QT Interval:  328 QTC Calculation: 433 R Axis:   67 Text Interpretation:  Sinus tachycardia Nonspecific ST and T wave abnormality Abnormal ECG When compared with ECG of 02/17/2000, Arm lead reversal has been corrected T wave abnormality has improved Confirmed by [facility]  MD, Clinton Dragone ([id number]) on 06/03/2016 4:14:38 AM       Radiology Dg Chest 2 View  Result Date: 06/03/2016 CLINICAL DATA:  Cough and dyspnea. EXAM: CHEST  2 VIEW COMPARISON:  10/19/2003 FINDINGS: Heart is predominantly right-sided within ambiguity of the cardiac apex. Left-sided aortic arch. Left-sided gastric air bubble. Bibasilar atelectasis. No pneumonic consolidation. No pneumothorax. Hazy appearance of the right lung base laterally likely related to overlapping breast tissue. IMPRESSION: Bibasilar atelectasis.  Probable dextrocardia. Electronically  Signed   By: Tollie Ethavid  Kwon M.D.   On: 06/03/2016 04:25    Procedures Procedures (including critical care time)  Medications Ordered in ED Medications  albuterol (PROVENTIL) (2.5 MG/3ML) 0.083% nebulizer solution (not administered)  albuterol (PROVENTIL) (2.5 MG/3ML) 0.083% nebulizer solution (not administered)  ipratropium-albuterol (DUONEB) 0.5-2.5 (3) MG/3ML nebulizer solution 3 mL (not administered)  methylPREDNISolone sodium succinate (SOLU-MEDROL) 125 mg/2 mL injection 125 mg (not administered)  albuterol (PROVENTIL) (2.5 MG/3ML) 0.083% nebulizer solution 5 mg (5 mg Nebulization Given 06/03/16 0335)     Initial Impression / Assessment and Plan / ED Course  I have reviewed the triage vital signs and the nursing notes.  Pertinent labs & imaging results that were available during my care of the patient were reviewed by me and considered in my medical decision making (see chart for details).  Dyspnea out of proportion to  physical findings. Chest x-ray shows no evidence of pneumonia. Will give additional albuterol with ipratropium, and is screened for pulmonary embolism with d-dimer and screen for congestive heart failure with BNP. Old records are reviewed, confirming recent urgent care visit with treatment with prednisone and azithromycin.  Following above-noted treatment, she is feeling somewhat better. She is still dyspneic, but heart rate has come down to 100. Laboratory workup shows no evidence of PE or CHF. I do not see any other interventions to be helpful. Will try her on a higher dose of prednisone for 5 days and then resume her taper. Patient is comfortable with this treatment option. She is advised to return should symptoms worsen.  Final Clinical Impressions(s) / ED Diagnoses   Final diagnoses:  Respiratory tract infection  Shortness of breath    New Prescriptions Current Discharge Medication List    START taking these medications   Details  predniSONE (DELTASONE) 20 MG  tablet Take 3 tablets (60 mg total) by mouth daily with breakfast. 2 tabs po daily x 4 days Qty: 15 tablet, Refills: 0         Dione Booze, MD 06/03/16 [phone]

## 2016-06-03 NOTE — ED Notes (Signed)
Pt in X ray

## 2016-06-03 NOTE — ED Notes (Signed)
Pt stable, ambulatory, states understanding of discharge instructions 

## 2016-07-12 DIAGNOSIS — Z7951 Long term (current) use of inhaled steroids: Secondary | ICD-10-CM | POA: Diagnosis not present

## 2016-07-12 DIAGNOSIS — J4551 Severe persistent asthma with (acute) exacerbation: Secondary | ICD-10-CM | POA: Diagnosis not present

## 2016-07-12 DIAGNOSIS — Z0000 Encounter for general adult medical examination without abnormal findings: Secondary | ICD-10-CM | POA: Diagnosis not present

## 2016-07-12 DIAGNOSIS — K219 Gastro-esophageal reflux disease without esophagitis: Secondary | ICD-10-CM | POA: Diagnosis not present

## 2016-07-12 DIAGNOSIS — J449 Chronic obstructive pulmonary disease, unspecified: Secondary | ICD-10-CM | POA: Diagnosis not present

## 2016-07-12 DIAGNOSIS — E669 Obesity, unspecified: Secondary | ICD-10-CM | POA: Diagnosis not present

## 2016-07-12 DIAGNOSIS — Z6841 Body Mass Index (BMI) 40.0 and over, adult: Secondary | ICD-10-CM | POA: Diagnosis not present

## 2016-07-12 DIAGNOSIS — Q336 Congenital hypoplasia and dysplasia of lung: Secondary | ICD-10-CM | POA: Diagnosis not present

## 2016-08-03 DIAGNOSIS — R69 Illness, unspecified: Secondary | ICD-10-CM | POA: Diagnosis not present

## 2016-08-03 DIAGNOSIS — Z124 Encounter for screening for malignant neoplasm of cervix: Secondary | ICD-10-CM | POA: Diagnosis not present

## 2016-08-07 DIAGNOSIS — Z0000 Encounter for general adult medical examination without abnormal findings: Secondary | ICD-10-CM | POA: Diagnosis not present

## 2016-12-08 DIAGNOSIS — Q333 Agenesis of lung: Secondary | ICD-10-CM | POA: Diagnosis not present

## 2016-12-08 DIAGNOSIS — J4550 Severe persistent asthma, uncomplicated: Secondary | ICD-10-CM | POA: Diagnosis not present

## 2016-12-08 DIAGNOSIS — J449 Chronic obstructive pulmonary disease, unspecified: Secondary | ICD-10-CM | POA: Diagnosis not present

## 2017-02-14 DIAGNOSIS — R69 Illness, unspecified: Secondary | ICD-10-CM | POA: Diagnosis not present

## 2017-02-26 ENCOUNTER — Encounter (HOSPITAL_COMMUNITY): Payer: Self-pay | Admitting: Family Medicine

## 2017-02-26 ENCOUNTER — Ambulatory Visit (HOSPITAL_COMMUNITY)
Admission: EM | Admit: 2017-02-26 | Discharge: 2017-02-26 | Disposition: A | Discharge status: Home / Self Care | Payer: Medicare HMO | Attending: Family Medicine | Admitting: Family Medicine

## 2017-02-26 DIAGNOSIS — R69 Illness, unspecified: Secondary | ICD-10-CM | POA: Diagnosis not present

## 2017-02-26 DIAGNOSIS — J4521 Mild intermittent asthma with (acute) exacerbation: Secondary | ICD-10-CM

## 2017-02-26 MED ORDER — BUDESONIDE-FORMOTEROL FUMARATE 160-4.5 MCG/ACT IN AERO: 2.0000 | INHALATION_SPRAY | Freq: Two times a day (BID) | RESPIRATORY_TRACT | 1 refills | Status: DC

## 2017-02-26 MED ORDER — IPRATROPIUM-ALBUTEROL 0.5-2.5 (3) MG/3ML IN SOLN
RESPIRATORY_TRACT | Status: AC
  Filled 2017-02-26: qty 3

## 2017-02-26 MED ORDER — ALBUTEROL SULFATE (2.5 MG/3ML) 0.083% IN NEBU: 2.5000 mg | INHALATION_SOLUTION | Freq: Four times a day (QID) | RESPIRATORY_TRACT | 1 refills | Status: DC | PRN

## 2017-02-26 MED ORDER — ALBUTEROL SULFATE HFA 108 (90 BASE) MCG/ACT IN AERS: 1.0000 | INHALATION_SPRAY | Freq: Four times a day (QID) | RESPIRATORY_TRACT | 1 refills | Status: DC | PRN

## 2017-02-26 MED ORDER — ALBUTEROL SULFATE (2.5 MG/3ML) 0.083% IN NEBU
2.5000 mg | INHALATION_SOLUTION | Freq: Once | RESPIRATORY_TRACT | Status: AC
  Administered 2017-02-26: 2.5 mg via RESPIRATORY_TRACT

## 2017-02-26 MED ORDER — PREDNISONE 10 MG (21) PO TBPK: ORAL_TABLET | Freq: Every day | ORAL | 0 refills | Status: DC

## 2017-02-26 NOTE — ED Triage Notes (Signed)
Pt here for asthma and wheezing. Reports she needs a treatment.

## 2017-02-28 NOTE — ED Provider Notes (Signed)
  [facility]   [id number] 02/26/17 Arrival Time: 1322  ASSESSMENT & PLAN:  1. Mild intermittent asthma with exacerbation     Meds ordered this encounter  Medications  . albuterol (PROVENTIL) (2.5 MG/3ML) 0.083% nebulizer solution 2.5 mg  . albuterol (PROVENTIL HFA;VENTOLIN HFA) 108 (90 Base) MCG/ACT inhaler    Sig: Inhale 1-2 puffs into the lungs every 6 (six) hours as needed for wheezing or shortness of breath.    Dispense:  1 Inhaler    Refill:  1  . budesonide-formoterol (SYMBICORT) 160-4.5 MCG/ACT inhaler    Sig: Inhale 2 puffs into the lungs 2 (two) times daily.    Dispense:  1 Inhaler    Refill:  1  . albuterol (PROVENTIL) (2.5 MG/3ML) 0.083% nebulizer solution    Sig: Take 3 mLs (2.5 mg total) by nebulization every 6 (six) hours as needed for wheezing or shortness of breath.    Dispense:  75 mL    Refill:  1  . predniSONE (STERAPRED UNI-PAK 21 TAB) 10 MG (21) TBPK tablet    Sig: Take by mouth daily. Take as directed.    Dispense:  21 tablet    Refill:  0   Close observation. Will proceed to the Emergency Dept if not improving after starting prednisone. May f/u here as needed. Reviewed expectations re: course of current medical issues. Questions answered. Outlined signs and symptoms indicating need for more acute intervention. Patient verbalized understanding. After Visit Summary given.   SUBJECTIVE:  Phillips ClimesBrandie V Leitz is a 41 y.o. female who presents with complaint of asthma exacerbation for the past several days. Out of albuterol and daily asthma medications. Wheezing, mostly at night. Able to perform ADL without difficulty. No SOB reported during visit. Afebrile. No recent illnesses. No specific aggravating or alleviating factors reported. No OTC treatment.  ROS: As per HPI.   OBJECTIVE:  Vitals:   02/26/17 1343  BP: 129/68  Pulse: 82  Resp: 18  SpO2: 99%    General appearance: alert; no distress Eyes: PERRLA; EOMI; conjunctiva normal HENT:  normocephalic; atraumatic; TMs normal; nasal mucosa normal; oral mucosa normal Neck: supple Lungs: able to speak full sentences; expiratory wheezing bilaterally Heart: regular rate and rhythm Skin: warm and dry Psychological: alert and cooperative; normal mood and affect  Allergies  Allergen Reactions  . Aspirin Other (See Comments)    Other Reaction: RYE  . Morphine Other (See Comments)    Family hx of allergy  . Morphine And Related     Past Medical History:  Diagnosis Date  . Asthma   . GERD (gastroesophageal reflux disease)   . Lung abnormality    "right lung underdeveloped"   Social History   Social History  . Marital status: Single    Spouse name: N/A  . Number of children: N/A  . Years of education: N/A   Occupational History  . Not on file.   Social History Main Topics  . Smoking status: Never Smoker  . Smokeless tobacco: Not on file  . Alcohol use No  . Drug use: No  . Sexual activity: Not on file   Other Topics Concern  . Not on file   Social History Narrative  . No narrative on file     Mardella LaymanHagler, Willard Farquharson, MD 02/28/17 [phone]

## 2017-05-11 DIAGNOSIS — H52223 Regular astigmatism, bilateral: Secondary | ICD-10-CM | POA: Diagnosis not present

## 2017-05-11 DIAGNOSIS — Z0100 Encounter for examination of eyes and vision without abnormal findings: Secondary | ICD-10-CM | POA: Diagnosis not present

## 2017-06-08 DIAGNOSIS — J301 Allergic rhinitis due to pollen: Secondary | ICD-10-CM | POA: Diagnosis not present

## 2017-06-08 DIAGNOSIS — J4551 Severe persistent asthma with (acute) exacerbation: Secondary | ICD-10-CM | POA: Diagnosis not present

## 2017-06-08 DIAGNOSIS — J4550 Severe persistent asthma, uncomplicated: Secondary | ICD-10-CM | POA: Diagnosis not present

## 2017-07-09 ENCOUNTER — Encounter (HOSPITAL_COMMUNITY): Payer: Self-pay | Admitting: Emergency Medicine

## 2017-07-09 ENCOUNTER — Other Ambulatory Visit: Payer: Self-pay

## 2017-07-09 ENCOUNTER — Ambulatory Visit (HOSPITAL_COMMUNITY)
Admission: EM | Admit: 2017-07-09 | Discharge: 2017-07-09 | Disposition: A | Discharge status: Home / Self Care | Payer: Medicare HMO | Attending: Emergency Medicine | Admitting: Emergency Medicine

## 2017-07-09 DIAGNOSIS — B9789 Other viral agents as the cause of diseases classified elsewhere: Secondary | ICD-10-CM | POA: Diagnosis not present

## 2017-07-09 DIAGNOSIS — J069 Acute upper respiratory infection, unspecified: Secondary | ICD-10-CM

## 2017-07-09 DIAGNOSIS — R062 Wheezing: Secondary | ICD-10-CM

## 2017-07-09 MED ORDER — GUAIFENESIN ER 600 MG PO TB12: 1200.0000 mg | ORAL_TABLET | Freq: Two times a day (BID) | ORAL | 0 refills | Status: AC

## 2017-07-09 MED ORDER — ALBUTEROL SULFATE HFA 108 (90 BASE) MCG/ACT IN AERS: 1.0000 | INHALATION_SPRAY | Freq: Four times a day (QID) | RESPIRATORY_TRACT | 0 refills | Status: DC | PRN

## 2017-07-09 MED ORDER — PREDNISONE 20 MG PO TABS: 40.0000 mg | ORAL_TABLET | Freq: Every day | ORAL | 0 refills | Status: AC

## 2017-07-09 NOTE — ED Provider Notes (Signed)
[facility]    CSN: [id number] Arrival date & time: 07/09/17  1630     History   Chief Complaint Chief Complaint  Patient presents with  . URI    HPI Heather Harding is a 42 y.o. female.   Salle presents with complaints of sore throat, congestion, runny nose, productive cough, headache, fevers. Symptoms started 3 days ago. Have not worsened. States she has had the flu but this feels less severe. Denies shortness of breath . Denies gi/gu complains although stool has been more soft. 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 Took tylenol this afternoon. Has not taken her temperature, known tmax. Has been taking otc zinc, vit b and vit d for symptoms. History of asthma and gerd.    ROS per HPI.       Past Medical History:  Diagnosis Date  . Asthma   . GERD (gastroesophageal reflux disease)   . Lung abnormality    "right lung underdeveloped"    Patient Active Problem List   Diagnosis Date Noted  . ANEMIA, IRON DEFICIENCY, UNSPEC. 07/05/2006  . ASTHMA, UNSPECIFIED 07/05/2006  . GASTROESOPHAGEAL REFLUX, NO ESOPHAGITIS 07/05/2006    History reviewed. No pertinent surgical history.  OB History    No data available       Home Medications    Prior to Admission medications   Medication Sig Start Date End Date Taking? Authorizing Provider  budesonide-formoterol (SYMBICORT) 160-4.5 MCG/ACT inhaler Inhale 2 puffs into the lungs 2 (two) times daily. 02/26/17  Yes Hagler, Arlys JohnBrian, MD  omeprazole (PRILOSEC) 10 MG capsule Take 10 mg by mouth as needed.   Yes [provider]  albuterol (PROVENTIL HFA;VENTOLIN HFA) 108 (90 Base) MCG/ACT inhaler Inhale 1-2 puffs into the lungs every 6 (six) hours as needed for wheezing or shortness of breath. 07/09/17   Georgetta HaberBurky, Natalie B, NP  guaiFENesin (MUCINEX) 600 MG 12 hr tablet Take 2 tablets (1,200 mg total) by mouth 2 (two) times daily for 7 days. 07/09/17 07/16/17  Georgetta HaberBurky, Natalie B, NP  predniSONE (DELTASONE) 20 MG  tablet Take 2 tablets (40 mg total) by mouth daily with breakfast for 5 days. 07/09/17 07/14/17  Georgetta HaberBurky, Natalie B, NP    Family History History reviewed. No pertinent family history.  Social History Social History   Tobacco Use  . Smoking status: Never Smoker  . Smokeless tobacco: Never Used  Substance Use Topics  . Alcohol use: No  . Drug use: No     Allergies   Aspirin; Morphine; and Morphine and related   Review of Systems Review of Systems   Physical Exam Triage Vital Signs ED Triage Vitals  Enc Vitals Group     BP 07/09/17 1838 137/84     Pulse Rate 07/09/17 1838 87     Resp --      Temp 07/09/17 1838 99 F (37.2 C)     Temp Source 07/09/17 1838 Oral     SpO2 07/09/17 1838 100 %     Weight --      Height --      Head Circumference --      Peak Flow --      Pain Score 07/09/17 1832 0     Pain Loc --      Pain Edu? --      Excl. in GC? --    No data found.  Updated Vital Signs BP 137/84 (BP Location: Left Arm)   Pulse 87   Temp  99 F (37.2 C) (Oral)   LMP 06/21/2017 (Approximate)   SpO2 100%   Visual Acuity Right Eye Distance:   Left Eye Distance:   Bilateral Distance:    Right Eye Near:   Left Eye Near:    Bilateral Near:     Physical Exam  Constitutional: She is oriented to person, place, and time. She appears well-developed and well-nourished. No distress.  HENT:  Head: Normocephalic and atraumatic.  Right Ear: Tympanic membrane, external ear and ear canal normal.  Left Ear: Tympanic membrane, external ear and ear canal normal.  Nose: Rhinorrhea present. Right sinus exhibits no maxillary sinus tenderness and no frontal sinus tenderness. Left sinus exhibits no maxillary sinus tenderness and no frontal sinus tenderness.  Mouth/Throat: Uvula is midline, oropharynx is clear and moist and mucous membranes are normal. No tonsillar exudate.  Eyes: Conjunctivae and EOM are normal. Pupils are equal, round, and reactive to light.  Cardiovascular:  Normal rate, regular rhythm and normal heart sounds.  Pulmonary/Chest: Effort normal. She has wheezes.  Lymphadenopathy:    She has no cervical adenopathy.  Neurological: She is alert and oriented to person, place, and time.  Skin: Skin is warm and dry.     UC Treatments / Results  Labs (all labs ordered are listed, but only abnormal results are displayed) Labs Reviewed - No data to display  EKG  EKG Interpretation None       Radiology No results found.  Procedures Procedures (including critical care time)  Medications Ordered in UC Medications - No data to display   Initial Impression / Assessment and Plan / UC Course  I have reviewed the triage vital signs and the nursing notes.  Pertinent labs & imaging results that were available during my care of the patient were reviewed by me and considered in my medical decision making (see chart for details).     5 days of prednisone, use of albuterol as needed for wheezing. History and physical consistent with viral illness.  Non toxic in appearance, vitals stable. Tylenol and/or ibuprofen as needed for pain or fevers.  Return precautions provided. Patient verbalized understanding and agreeable to plan.    Final Clinical Impressions(s) / UC Diagnoses   Final diagnoses:  Viral URI with cough  Wheezing    ED Discharge Orders        Ordered    albuterol (PROVENTIL HFA;VENTOLIN HFA) 108 (90 Base) MCG/ACT inhaler  Every 6 hours PRN     07/09/17 1918    predniSONE (DELTASONE) 20 MG tablet  Daily with breakfast     07/09/17 1918    guaiFENesin (MUCINEX) 600 MG 12 hr tablet  2 times daily     07/09/17 1918       Controlled Substance Prescriptions  Controlled Substance Registry consulted? Not Applicable   Georgetta Haber, NP 07/09/17 1610

## 2017-07-09 NOTE — ED Triage Notes (Signed)
Pt states she started with a sore throat on Friday night.  On Saturday she reports nasal congestion and a cough and feeling very hot. She did not measure her temperature.

## 2017-07-09 NOTE — Discharge Instructions (Signed)
Push fluids to ensure adequate hydration and keep secretions thin.  Tylenol and/or ibuprofen as needed for pain or fevers.  Complete course of prednisone. Albuterol as needed for wheezing. Mucinex as an expectorant. If symptoms worsen or do not improve in the next week to return to be seen or to follow up with your PCP.

## 2017-08-22 ENCOUNTER — Ambulatory Visit (HOSPITAL_COMMUNITY)
Admission: EM | Admit: 2017-08-22 | Discharge: 2017-08-22 | Disposition: A | Discharge status: Home / Self Care | Payer: Medicare HMO | Attending: Family Medicine | Admitting: Family Medicine

## 2017-08-22 ENCOUNTER — Encounter (HOSPITAL_COMMUNITY): Payer: Self-pay | Admitting: Emergency Medicine

## 2017-08-22 ENCOUNTER — Other Ambulatory Visit: Payer: Self-pay

## 2017-08-22 DIAGNOSIS — J309 Allergic rhinitis, unspecified: Secondary | ICD-10-CM

## 2017-08-22 MED ORDER — IPRATROPIUM BROMIDE 0.06 % NA SOLN: 2.0000 | Freq: Four times a day (QID) | NASAL | 0 refills | Status: DC

## 2017-08-22 MED ORDER — CETIRIZINE HCL 10 MG PO CAPS: 10.0000 mg | ORAL_CAPSULE | Freq: Every day | ORAL | 0 refills | Status: AC

## 2017-08-22 MED ORDER — BENZONATATE 100 MG PO CAPS: 100.0000 mg | ORAL_CAPSULE | Freq: Three times a day (TID) | ORAL | 0 refills | Status: DC

## 2017-08-22 MED ORDER — PREDNISONE 20 MG PO TABS: 40.0000 mg | ORAL_TABLET | Freq: Every day | ORAL | 0 refills | Status: AC

## 2017-08-22 MED ORDER — FLUTICASONE PROPIONATE 50 MCG/ACT NA SUSP: 2.0000 | Freq: Every day | NASAL | 0 refills | Status: DC

## 2017-08-22 NOTE — ED Provider Notes (Signed)
[facility]    CSN: [id number] Arrival date & time: 08/22/17  1258     History   Chief Complaint Chief Complaint  Patient presents with  . URI    HPI Heather Harding is a 42 y.o. female.   42 year old female comes in for "allergies".  States for the past 2 days, started out with throat irritation then with rhinorrhea, nasal congestion.  Now with productive cough and some chest tightness.  Has not had to use albuterol inhaler.  Denies fever, chills, night sweats.  Denies sneezing, though with some eye watering and itching.  States has taken 2 doses of Zyrtec though given it makes her drowsy, has not taking consistently.   Never smoker.     Past Medical History:  Diagnosis Date  . Asthma   . GERD (gastroesophageal reflux disease)   . Lung abnormality    "right lung underdeveloped"    Patient Active Problem List   Diagnosis Date Noted  . ANEMIA, IRON DEFICIENCY, UNSPEC. 07/05/2006  . ASTHMA, UNSPECIFIED 07/05/2006  . GASTROESOPHAGEAL REFLUX, NO ESOPHAGITIS 07/05/2006    History reviewed. No pertinent surgical history.  OB History   None      Home Medications    Prior to Admission medications   Medication Sig Start Date End Date Taking? Authorizing Provider  albuterol (PROVENTIL HFA;VENTOLIN HFA) 108 (90 Base) MCG/ACT inhaler Inhale 1-2 puffs into the lungs every 6 (six) hours as needed for wheezing or shortness of breath. 07/09/17   Georgetta HaberBurky, Natalie B, NP  benzonatate (TESSALON) 100 MG capsule Take 1 capsule (100 mg total) by mouth every 8 (eight) hours. 08/22/17   Cathie HoopsYu, Amy V, PA-C  budesonide-formoterol (SYMBICORT) 160-4.5 MCG/ACT inhaler Inhale 2 puffs into the lungs 2 (two) times daily. 02/26/17   Mardella LaymanHagler, Brian, MD  Cetirizine HCl (ZYRTEC ALLERGY) 10 MG CAPS Take 1 capsule (10 mg total) by mouth daily. 08/22/17   Cathie HoopsYu, Amy V, PA-C  fluticasone (FLONASE) 50 MCG/ACT nasal spray Place 2 sprays into both nostrils daily. 08/22/17   Cathie HoopsYu, Amy V, PA-C  ipratropium  (ATROVENT) 0.06 % nasal spray Place 2 sprays into both nostrils 4 (four) times daily. 08/22/17   Cathie HoopsYu, Amy V, PA-C  omeprazole (PRILOSEC) 10 MG capsule Take 10 mg by mouth as needed.    [provider]  predniSONE (DELTASONE) 20 MG tablet Take 2 tablets (40 mg total) by mouth daily for 5 days. 08/22/17 08/27/17  Belinda FisherYu, Amy V, PA-C    Family History No family history on file.  Social History Social History   Tobacco Use  . Smoking status: Never Smoker  . Smokeless tobacco: Never Used  Substance Use Topics  . Alcohol use: No  . Drug use: No     Allergies   Aspirin; Morphine; and Morphine and related   Review of Systems Review of Systems  Reason unable to perform ROS: See HPI as above.     Physical Exam Triage Vital Signs ED Triage Vitals  Enc Vitals Group     BP      Pulse      Resp      Temp      Temp src      SpO2      Weight      Height      Head Circumference      Peak Flow      Pain Score      Pain Loc      Pain Edu?  Excl. in GC?    No data found.  Updated Vital Signs BP 129/81 (BP Location: Left Arm) Comment (BP Location): large cuff  Pulse 83   Temp 98.1 F (36.7 C) (Oral)   Resp 20   LMP 08/15/2017   SpO2 96%   Physical Exam  Constitutional: She is oriented to person, place, and time. She appears well-developed and well-nourished. No distress.  HENT:  Head: Normocephalic and atraumatic.  Right Ear: Tympanic membrane, external ear and ear canal normal. Tympanic membrane is not erythematous and not bulging.  Left Ear: Tympanic membrane, external ear and ear canal normal. Tympanic membrane is not erythematous and not bulging.  Nose: Rhinorrhea present. Right sinus exhibits no maxillary sinus tenderness and no frontal sinus tenderness. Left sinus exhibits no maxillary sinus tenderness and no frontal sinus tenderness.  Mouth/Throat: Uvula is midline, oropharynx is clear and moist and mucous membranes are normal.  Eyes: Pupils are equal,  round, and reactive to light. Conjunctivae are normal.  Neck: Normal range of motion. Neck supple.  Cardiovascular: Normal rate, regular rhythm and normal heart sounds. Exam reveals no gallop and no friction rub.  No murmur heard. Pulmonary/Chest: Effort normal and breath sounds normal. She has no decreased breath sounds. She has no wheezes. She has no rhonchi. She has no rales.  Lymphadenopathy:    She has no cervical adenopathy.  Neurological: She is alert and oriented to person, place, and time.  Skin: Skin is warm and dry. She is not diaphoretic.  Psychiatric: She has a normal mood and affect. Her behavior is normal. Judgment normal.     UC Treatments / Results  Labs (all labs ordered are listed, but only abnormal results are displayed) Labs Reviewed - No data to display  EKG None Radiology No results found.  Procedures Procedures (including critical care time)  Medications Ordered in UC Medications - No data to display   Initial Impression / Assessment and Plan / UC Course  I have reviewed the triage vital signs and the nursing notes.  Pertinent labs & imaging results that were available during my care of the patient were reviewed by me and considered in my medical decision making (see chart for details).    Discussed seasonal allergies versus viral illness causing symptoms.  Given patient with history of asthma, with some chest tightness, will provide prednisone.  Lungs clear to auscultation bilaterally without adventitious lung sounds right now.  Other symptomatic treatment discussed.  Push fluids.  Return precautions given.  Patient expresses understanding and agrees to plan.  Final Clinical Impressions(s) / UC Diagnoses   Final diagnoses:  Allergic rhinitis, unspecified seasonality, unspecified trigger    ED Discharge Orders        Ordered    predniSONE (DELTASONE) 20 MG tablet  Daily     08/22/17 1342    ipratropium (ATROVENT) 0.06 % nasal spray  4 times  daily     08/22/17 1342    fluticasone (FLONASE) 50 MCG/ACT nasal spray  Daily     08/22/17 1342    Cetirizine HCl (ZYRTEC ALLERGY) 10 MG CAPS  Daily     08/22/17 1342    benzonatate (TESSALON) 100 MG capsule  Every 8 hours     08/22/17 1342        Belinda Fisher, PA-C 08/22/17 1347

## 2017-08-22 NOTE — ED Triage Notes (Signed)
Patient complains of "allergies".  Symptoms came on quickly.  Complains of raspy voice, intermittent loss of voice, cough, intermittently productive with yellow phlegm and some sob.

## 2017-08-22 NOTE — Discharge Instructions (Signed)
Tessalon for cough. Prednisone for chest tightness, continue albuterol as needed. Start flonase, atrovent nasal spray, zyrtec for nasal congestion/drainage. You can use over the counter nasal saline rinse such as neti pot for nasal congestion. Keep hydrated, your urine should be clear to pale yellow in color. Tylenol/motrin for fever and pain. Monitor for any worsening of symptoms, chest pain, shortness of breath, wheezing, swelling of the throat, follow up for reevaluation.   For sore throat try using a honey-based tea. Use 3 teaspoons of honey with juice squeezed from half lemon. Place shaved pieces of ginger into 1/2-1 cup of water and warm over stove top. Then mix the ingredients and repeat every 4 hours as needed.

## 2017-10-26 DIAGNOSIS — J301 Allergic rhinitis due to pollen: Secondary | ICD-10-CM | POA: Diagnosis not present

## 2017-10-26 DIAGNOSIS — J4550 Severe persistent asthma, uncomplicated: Secondary | ICD-10-CM | POA: Diagnosis not present

## 2017-10-26 DIAGNOSIS — Q333 Agenesis of lung: Secondary | ICD-10-CM | POA: Diagnosis not present

## 2018-02-12 DIAGNOSIS — N390 Urinary tract infection, site not specified: Secondary | ICD-10-CM | POA: Diagnosis not present

## 2018-03-29 DIAGNOSIS — L0292 Furuncle, unspecified: Secondary | ICD-10-CM | POA: Diagnosis not present

## 2018-04-06 DIAGNOSIS — L02213 Cutaneous abscess of chest wall: Secondary | ICD-10-CM | POA: Diagnosis not present

## 2018-04-11 DIAGNOSIS — H8110 Benign paroxysmal vertigo, unspecified ear: Secondary | ICD-10-CM | POA: Diagnosis not present

## 2018-06-05 DIAGNOSIS — G43109 Migraine with aura, not intractable, without status migrainosus: Secondary | ICD-10-CM | POA: Diagnosis not present

## 2018-07-19 DIAGNOSIS — J4550 Severe persistent asthma, uncomplicated: Secondary | ICD-10-CM | POA: Diagnosis not present

## 2018-07-19 DIAGNOSIS — J4551 Severe persistent asthma with (acute) exacerbation: Secondary | ICD-10-CM | POA: Diagnosis not present

## 2019-06-13 ENCOUNTER — Ambulatory Visit: Payer: Medicare HMO | Admitting: Cardiology

## 2019-06-13 ENCOUNTER — Emergency Department (HOSPITAL_COMMUNITY): Payer: BC Managed Care – PPO

## 2019-06-13 ENCOUNTER — Other Ambulatory Visit: Payer: Self-pay

## 2019-06-13 ENCOUNTER — Encounter (HOSPITAL_COMMUNITY): Payer: Self-pay | Admitting: Emergency Medicine

## 2019-06-13 ENCOUNTER — Emergency Department (HOSPITAL_COMMUNITY)
Admission: EM | Admit: 2019-06-13 | Discharge: 2019-06-14 | Disposition: A | Discharge status: Home / Self Care | Payer: BC Managed Care – PPO | Attending: Emergency Medicine | Admitting: Emergency Medicine

## 2019-06-13 DIAGNOSIS — R519 Headache, unspecified: Secondary | ICD-10-CM | POA: Diagnosis not present

## 2019-06-13 DIAGNOSIS — U071 COVID-19: Secondary | ICD-10-CM | POA: Diagnosis not present

## 2019-06-13 DIAGNOSIS — R7989 Other specified abnormal findings of blood chemistry: Secondary | ICD-10-CM | POA: Diagnosis present

## 2019-06-13 DIAGNOSIS — Z79899 Other long term (current) drug therapy: Secondary | ICD-10-CM | POA: Insufficient documentation

## 2019-06-13 DIAGNOSIS — J45909 Unspecified asthma, uncomplicated: Secondary | ICD-10-CM | POA: Diagnosis not present

## 2019-06-13 DIAGNOSIS — R0602 Shortness of breath: Secondary | ICD-10-CM | POA: Diagnosis not present

## 2019-06-13 LAB — BASIC METABOLIC PANEL
Anion gap: 7 (ref 5–15)
BUN: <5 mg/dL — ABNORMAL LOW (ref 6–20)
CO2: 27 mmol/L (ref 22–32)
Calcium: 9.2 mg/dL (ref 8.9–10.3)
Chloride: 105 mmol/L (ref 98–111)
Creatinine, Ser: 0.81 mg/dL (ref 0.44–1.00)
GFR calc Af Amer: >60 mL/min (ref >60)
GFR calc non Af Amer: >60 mL/min (ref >60)
Glucose, Bld: 109 mg/dL — ABNORMAL HIGH (ref 70–99)
Potassium: 3.7 mmol/L (ref 3.5–5.1)
Sodium: 139 mmol/L (ref 135–145)

## 2019-06-13 LAB — TROPONIN I (HIGH SENSITIVITY)
Troponin I (High Sensitivity): <2 ng/L (ref <18)
Troponin I (High Sensitivity): <2 ng/L (ref <18)

## 2019-06-13 LAB — I-STAT BETA HCG BLOOD, ED (MC, WL, AP ONLY): I-stat hCG, quantitative: <5 m[IU]/mL (ref <5)

## 2019-06-13 LAB — CBC
HCT: 37.3 % (ref 36.0–46.0)
Hemoglobin: 11.2 g/dL — ABNORMAL LOW (ref 12.0–15.0)
MCH: 23 pg — ABNORMAL LOW (ref 26.0–34.0)
MCHC: 30 g/dL (ref 30.0–36.0)
MCV: 76.7 fL — ABNORMAL LOW (ref 80.0–100.0)
Platelets: 304 10*3/uL (ref 150–400)
RBC: 4.86 MIL/uL (ref 3.87–5.11)
RDW: 17.9 % — ABNORMAL HIGH (ref 11.5–15.5)
WBC: 6.1 10*3/uL (ref 4.0–10.5)
nRBC: 0 % (ref 0.0–0.2)

## 2019-06-13 MED ORDER — TECHNETIUM TO 99M ALBUMIN AGGREGATED
1.6300 | Freq: Once | INTRAVENOUS | Status: AC | PRN
  Administered 2019-06-13: 1.63 via INTRAVENOUS

## 2019-06-13 MED ORDER — SODIUM CHLORIDE 0.9% FLUSH
3.0000 mL | Freq: Once | INTRAVENOUS | Status: AC
  Administered 2019-06-13: 3 mL via INTRAVENOUS

## 2019-06-13 MED ORDER — IOHEXOL 350 MG/ML SOLN
80.0000 mL | Freq: Once | INTRAVENOUS | Status: AC | PRN
  Administered 2019-06-13: 80 mL via INTRAVENOUS

## 2019-06-13 NOTE — ED Notes (Signed)
Patient transported to V/Q scan 

## 2019-06-13 NOTE — ED Provider Notes (Signed)
Care assumed from [name], [place].  Please see her full H&P.  In short,  Heather Harding is a 44 y.o. female presents for evaluation of elevated D-dimer on Tuesday.  [facility] and has had worsening shortness of breath throughout the week with and without exertion.  She tested positive for COVID-19 approximately 2 weeks ago.  Physical Exam  BP 136/82   Pulse 85   Temp 98.3 F (36.8 C) (Oral)   Resp (!) 27   SpO2 100%   Physical Exam Vitals and nursing note reviewed.  Constitutional:      General: She is not in acute distress.    Appearance: She is well-developed.  HENT:     Head: Normocephalic.  Eyes:     General: No scleral icterus.    Conjunctiva/sclera: Conjunctivae normal.  Cardiovascular:     Rate and Rhythm: Normal rate.  Pulmonary:     Effort: Pulmonary effort is normal.  Musculoskeletal:        General: Normal range of motion.     Cervical back: Normal range of motion.  Skin:    General: Skin is warm and dry.  Neurological:     Mental Status: She is alert.     ED Course/Procedures   Clinical Course as of Jun 12 2022  Fri Jun 13, 2019  1900 Plan: Patient pending CT angiogram to evaluate for pulmonary embolism.  Will dispo after exam has completed.   [HM]  2021 CT scan nondiagnostic for pulmonary embolism.  Will order emergent VQ scan.  CT Angio Chest PE W and/or Wo Contrast [HM]    Clinical Course User Index [HM] Ardis Fullwood, Dahlia Client, PA-C    Procedures  MDM   Patient here for elevated D-dimer in the setting of Covid and shortness of breath.  CT angiogram is nondiagnostic.  Radiology recommending VQ scan.  I discussed with neuroradiology who will perform tonight.  Discussed with patient who is amenable to the plan.  10:57 PM At shift change care was transferred to [name], PA-C who will follow pending studies, re-evaulate and determine disposition.      COVID-19  Shortness of  breath  Elevated d-dimer    [name], [name] 06/13/19 2257    Pricilla Loveless, MD 06/17/19 [phone]

## 2019-06-13 NOTE — ED Notes (Signed)
Pt ambulated to RR9, NAD

## 2019-06-13 NOTE — ED Provider Notes (Signed)
  Patient care signed out at end of shift by [facility], PA-C. The patient presented for evaluation of SOB in the setting of recent COVID positive test (w/p 2 weeks), and found to have an elevated d-dimer by PCP. She has received CTA that was nondiagnostic and a VQ scan is pending.   On re-evaluation, the patient is comfortable. She denies any symptoms of SOB. Her O2 sat on RA is 98%. She is ambulated and maintains O2 sat of 96% or above with DOE.   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 She is felt appropriate for discharge home.     [name], PA-C 06/14/19 1030    Derwood Kaplan, MD 06/14/19 1550

## 2019-06-13 NOTE — ED Triage Notes (Signed)
Pt here from md office with an elevated D dimmer post 2 weeks post covid with some slight sob , pt sent to r/o PE

## 2019-06-13 NOTE — ED Provider Notes (Signed)
[facility] EMERGENCY DEPARTMENT Provider Note   CSN: [id number] Arrival date & time: 06/13/19  1122     History No chief complaint on file.   Heather Harding is a 44 y.o. female with a past medical history significant for asthma, GERD, and right lung underdevelopment who presents to the ED due to an elevated D-dimer on Tuesday.  Patient states she went to [facility] due to shortness of breath where she was informed Tuesday night she had an elevated d-dimer. Patient admits to worsening shortness of breath since Sunday that occurs both with and without exertion. She tested positive for COVID on January 15th. Patient also admits to L-sided chest "cramps" that are intermittent in nature and occur for a few minutes at a time. Patient denies drug, alcohol, and tobacco use. Denies a family history of early CAD. No association with exertion.  Patient denies history of blood clots, recent surgeries, recent immobilizations, and hormonal treatments.  Patient denies abdominal pain, nausea, vomiting, diarrhea, fever, and chills.  Patient denies urinary symptoms.  Patient denies lower extremity edema.    Past Medical History:  Diagnosis Date  . Asthma   . GERD (gastroesophageal reflux disease)   . Lung abnormality    "right lung underdeveloped"    Patient Active Problem List   Diagnosis Date Noted  . ANEMIA, IRON DEFICIENCY, UNSPEC. 07/05/2006  . ASTHMA, UNSPECIFIED 07/05/2006  . GASTROESOPHAGEAL REFLUX, NO ESOPHAGITIS 07/05/2006    History reviewed. No pertinent surgical history.   OB History   No obstetric history on file.     No family history on file.  Social History   Tobacco Use  . Smoking status: Never Smoker  . Smokeless tobacco: Never Used  Substance Use Topics  . Alcohol use: No  . Drug use: No    Home Medications Prior to Admission medications   Medication Sig Start Date End Date Taking? Authorizing Provider  albuterol (PROVENTIL HFA;VENTOLIN  HFA) 108 (90 Base) MCG/ACT inhaler Inhale 1-2 puffs into the lungs every 6 (six) hours as needed for wheezing or shortness of breath. 07/09/17   Georgetta Haber, NP  benzonatate (TESSALON) 100 MG capsule Take 1 capsule (100 mg total) by mouth every 8 (eight) hours. 08/22/17   Cathie Hoops, Amy V, PA-C  budesonide-formoterol (SYMBICORT) 160-4.5 MCG/ACT inhaler Inhale 2 puffs into the lungs 2 (two) times daily. 02/26/17   Mardella Layman, MD  Cetirizine HCl (ZYRTEC ALLERGY) 10 MG CAPS Take 1 capsule (10 mg total) by mouth daily. 08/22/17   Cathie Hoops, Amy V, PA-C  fluticasone (FLONASE) 50 MCG/ACT nasal spray Place 2 sprays into both nostrils daily. 08/22/17   Cathie Hoops, Amy V, PA-C  ipratropium (ATROVENT) 0.06 % nasal spray Place 2 sprays into both nostrils 4 (four) times daily. 08/22/17   Cathie Hoops, Amy V, PA-C  omeprazole (PRILOSEC) 10 MG capsule Take 10 mg by mouth as needed.    [provider]    Allergies    Aspirin, Morphine, and Morphine and related  Review of Systems   Review of Systems  Constitutional: Negative for chills and fever.  Respiratory: Positive for shortness of breath.   Cardiovascular: Positive for chest pain. Negative for leg swelling.  Gastrointestinal: Negative for abdominal pain, diarrhea, nausea and vomiting.  Genitourinary: Negative for dysuria.  Neurological: Positive for headaches.  All other systems reviewed and are negative.   Physical Exam Updated Vital Signs BP 139/74 (BP Location: Left Arm)   Pulse 77   Temp 98.3 F (36.8  C) (Oral)   Resp 16   SpO2 100%   Physical Exam Vitals and nursing note reviewed.  Constitutional:      General: She is not in acute distress.    Appearance: She is not toxic-appearing.  HENT:     Head: Normocephalic.  Eyes:     Pupils: Pupils are equal, round, and reactive to light.  Cardiovascular:     Rate and Rhythm: Normal rate and regular rhythm.     Pulses: Normal pulses.     Heart sounds: Normal heart sounds. No murmur. No friction rub. No  gallop.   Pulmonary:     Effort: Pulmonary effort is normal.     Breath sounds: Normal breath sounds.     Comments: Respirations equal and unlabored, patient able to speak in full sentences, lungs clear to auscultation bilaterally Chest:     Comments: Left sided anterior chest wall tenderness to palpation Abdominal:     General: Abdomen is flat. There is no distension.     Palpations: Abdomen is soft.     Tenderness: There is no abdominal tenderness. There is no guarding or rebound.  Musculoskeletal:     Cervical back: Neck supple.     Comments: Able to move all 4 extremities without difficulty.  Trace nonpitting edema bilaterally.  No calf tenderness bilaterally.  Negative Homans sign bilaterally.  Skin:    General: Skin is warm and dry.  Neurological:     General: No focal deficit present.     Mental Status: She is alert.  Psychiatric:        Mood and Affect: Mood normal.        Behavior: Behavior normal.     ED Results / Procedures / Treatments   Labs (all labs ordered are listed, but only abnormal results are displayed) Labs Reviewed  BASIC METABOLIC PANEL - Abnormal; Notable for the following components:      Result Value   Glucose, Bld 109 (*)    BUN <5 (*)    All other components within normal limits  CBC - Abnormal; Notable for the following components:   Hemoglobin 11.2 (*)    MCV 76.7 (*)    MCH 23.0 (*)    RDW 17.9 (*)    All other components within normal limits  I-STAT BETA HCG BLOOD, ED ([facility], [facility], [facility] ONLY)  TROPONIN I (HIGH SENSITIVITY)  TROPONIN I (HIGH SENSITIVITY)    EKG None  Radiology DG Chest 2 View  Result Date: 06/13/2019 CLINICAL DATA:  Shortness of breath, history of hypoplastic right lung EXAM: CHEST - 2 VIEW COMPARISON:  06/03/2016 FINDINGS: Frontal and lateral views of the chest demonstrate stable cardiac silhouette. Congenital hypoplasia right lung again noted. No acute airspace disease, effusion, or pneumothorax. No acute bony  abnormalities. IMPRESSION: 1. Stable exam, no acute process. Electronically Signed   By: Randa Ngo M.D.   On: 06/13/2019 12:17    Procedures Procedures (including critical care time)  Medications Ordered in ED Medications  sodium chloride flush (NS) 0.9 % injection 3 mL (has no administration in time range)    ED Course  I have reviewed the triage vital signs and the nursing notes.  Pertinent labs & imaging results that were available during my care of the patient were reviewed by me and considered in my medical decision making (see chart for details).    MDM Rules/Calculators/A&P  44 year old female presents to the ED for evaluation of shortness of breath x6 days.  Patient had a D-dimer drawn at [facility] which was elevated on Tuesday night.  Patient tested positive for Covid on 05/23/2019.  Shortness of breath is associated with intermittent left-sided chest "cramps".  Vitals all within normal limits.  Patient is afebrile, not tachycardic or hypoxic.  Patient in no acute distress and non-ill-appearing.  Physical exam reassuring.  Very minimal trace bilateral nonpitting edema.  No calf tenderness.  Negative Homans sign bilaterally.  Lungs clear to auscultation bilaterally.  Will obtain routine labs. will also obtain CTA to rule out PE given elevated D-dimer a few days prior.  ED reassuring with no leukocytosis.  BMP reassuring with normal renal function and no electrolyte abnormalities.  Initial troponin normal.  Will obtain delta troponin.  X-ray personally reviewed which is negative for signs of pneumonia, pneumothorax, or widened mediastinum.  EKG personally reviewed which demonstrates sinus rhythm and nonspecific ST and T wave abnormalities, but with no change from previous EKG.  Delta troponin normal. Doubt ACS given normal delta troponin and no change from previous EKG.   Patient handed off to [facility], PA-C who will follow-up on CTA results,  reassess patient, and determine disposition. If CTA is normal, patient can be discharged home with PCP follow-up. If CTA is normal, suspect shortness of breath related to residual effect of COVID.   Heather Harding was evaluated in Emergency Department on 06/13/2019 for the symptoms described in the history of present illness. She was evaluated in the context of the global COVID-19 pandemic, which necessitated consideration that the patient might be at risk for infection with the SARS-CoV-2 virus that causes COVID-19. Institutional protocols and algorithms that pertain to the evaluation of patients at risk for COVID-19 are in a state of rapid change based on information released by regulatory bodies including the CDC and federal and state organizations. These policies and algorithms were followed during the patient's care in the ED. Final Clinical Impression(s) / ED Diagnoses Final diagnoses:  None    Rx / DC Orders ED Discharge Orders    None       Mannie Stabile, PA-C 06/13/19 1859    Derwood Kaplan, MD 06/17/19 1624

## 2019-06-14 NOTE — ED Notes (Addendum)
Pt ambulated in room, O2 sats between 96-100%. NAD when ambulating. Will notifiy [place] PA

## 2019-06-14 NOTE — Discharge Instructions (Addendum)
Please follow up with your doctor for recheck in 2-3 days to insure you are improving. Continue your Albuterol treatments as usual.   Return to the emergency department with any new or worsening symptoms.

## 2019-06-24 ENCOUNTER — Ambulatory Visit: Payer: Medicare HMO | Admitting: Cardiology

## 2019-06-24 DIAGNOSIS — J4550 Severe persistent asthma, uncomplicated: Secondary | ICD-10-CM | POA: Diagnosis not present

## 2019-06-24 DIAGNOSIS — Z8616 Personal history of COVID-19: Secondary | ICD-10-CM | POA: Diagnosis not present

## 2019-07-18 DIAGNOSIS — J4550 Severe persistent asthma, uncomplicated: Secondary | ICD-10-CM | POA: Diagnosis not present

## 2019-07-18 DIAGNOSIS — J4551 Severe persistent asthma with (acute) exacerbation: Secondary | ICD-10-CM | POA: Diagnosis not present

## 2019-07-30 ENCOUNTER — Other Ambulatory Visit: Payer: Self-pay

## 2019-07-30 ENCOUNTER — Other Ambulatory Visit (HOSPITAL_COMMUNITY)
Admission: RE | Admit: 2019-07-30 | Discharge: 2019-07-30 | Disposition: A | Discharge status: Home / Self Care | Payer: Medicare HMO | Source: Ambulatory Visit | Attending: Nurse Practitioner | Admitting: Nurse Practitioner

## 2019-07-30 ENCOUNTER — Ambulatory Visit (INDEPENDENT_AMBULATORY_CARE_PROVIDER_SITE_OTHER): Payer: BC Managed Care – PPO | Admitting: Nurse Practitioner

## 2019-07-30 VITALS — BP 119/82 | HR 90 | Temp 98.3°F | Ht 65.4 in | Wt 262.4 lb

## 2019-07-30 DIAGNOSIS — B373 Candidiasis of vulva and vagina: Secondary | ICD-10-CM | POA: Diagnosis not present

## 2019-07-30 DIAGNOSIS — Z8616 Personal history of COVID-19: Secondary | ICD-10-CM | POA: Diagnosis not present

## 2019-07-30 DIAGNOSIS — N939 Abnormal uterine and vaginal bleeding, unspecified: Secondary | ICD-10-CM

## 2019-07-30 DIAGNOSIS — Z3202 Encounter for pregnancy test, result negative: Secondary | ICD-10-CM

## 2019-07-30 DIAGNOSIS — N898 Other specified noninflammatory disorders of vagina: Secondary | ICD-10-CM

## 2019-07-30 LAB — POCT URINE PREGNANCY: Preg Test, Ur: NEGATIVE

## 2019-07-30 NOTE — Progress Notes (Addendum)
This visit occurred during the SARS-CoV-2 public health emergency.  Safety protocols were in place, including screening questions prior to the visit, additional usage of staff PPE, and extensive cleaning of exam room while observing appropriate contact time as indicated for disinfecting solutions.  Subjective:     Patient ID: Heather Harding , female    DOB: 1975-07-19 , 44 y.o.   MRN: [id number]   Chief Complaint  Patient presents with  . Establish Care  . Vaginitis    HPI  Here to establish care. She had been seeing Dr. Nyoka Cowden.  She found out about [place] here through Dr. Karlton Lemon.  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  Single. No children.  She is from the area, her mother lives with her. LMP - 06/29/2019, she is spotting now.  When she started taking care of her grandfather thought to be related to stress.    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   PMH - Asthma (symbicort and albuterol) - she is followed by Dr. Toy Care ([facility]). She has been hospitalized for Pneumonia (she was on the ventilator in her 20's) and Sepsis in the past. Reports her right lung is underdeveloped. She denies any mental health illness. She is seeing a therapist. Hypoplastic right lung.   [facility] - father - diabetes, mother - unknown.    Vaginal itching and discharge, vaginal odor. Skin feels irritated. She has recently started back having intercourse. Saturday had intercourse without a condom. She has also had "tissue" present from her vagina.     Past Medical History:  Diagnosis Date  . Asthma   . GERD (gastroesophageal reflux disease)   . Lung abnormality    "right lung underdeveloped"     No family history on file.   Current Outpatient Medications:  .  albuterol (PROVENTIL HFA;VENTOLIN HFA) 108 (90 Base) MCG/ACT inhaler, Inhale 1-2 puffs into the lungs every 6 (six) hours as needed for wheezing or  shortness of breath., Disp: 1 Inhaler, Rfl: 0 .  budesonide-formoterol (SYMBICORT) 160-4.5 MCG/ACT inhaler, Inhale 2 puffs into the lungs 2 (two) times daily., Disp: 1 Inhaler, Rfl: 1 .  Cetirizine HCl (ZYRTEC ALLERGY) 10 MG CAPS, Take 1 capsule (10 mg total) by mouth daily., Disp: 30 capsule, Rfl: 0 .  omeprazole (PRILOSEC) 10 MG capsule, Take 10 mg by mouth as needed., Disp: , Rfl:  .  benzonatate (TESSALON) 100 MG capsule, Take 1 capsule (100 mg total) by mouth every 8 (eight) hours., Disp: 21 capsule, Rfl: 0 .  fluticasone (FLONASE) 50 MCG/ACT nasal spray, Place 2 sprays into both nostrils daily., Disp: 1 g, Rfl: 0 .  ipratropium (ATROVENT) 0.06 % nasal spray, Place 2 sprays into both nostrils 4 (four) times daily., Disp: 15 mL, Rfl: 0   Allergies  Allergen Reactions  . Aspirin Other (See Comments)    Other Reaction: RYE  . Morphine Other (See Comments)    Family hx of allergy  . Morphine And Related   . Codeine Rash     Review of Systems   Today's Vitals   07/30/19 1609  BP: 119/82  Pulse: 90  Temp: 98.3 F (36.8 C)  Weight: 262 lb 6.4 oz (119 kg)  Height: 5' 5.4" (1.661 m)   Body mass index is 43.13 kg/m.   Objective:  Physical Exam Constitutional:  General: She is not in acute distress.    Appearance: Normal appearance.  Cardiovascular:     Rate and Rhythm: Normal rate and regular rhythm.     Pulses: Normal pulses.     Heart sounds: Normal heart sounds. No murmur.  Pulmonary:     Effort: Pulmonary effort is normal. No respiratory distress.     Breath sounds: Normal breath sounds.  Abdominal:     General: Bowel sounds are normal. There is no distension.     Palpations: Abdomen is soft.  Genitourinary:       Comments: She has extra tissue present to right lateral vaginal wall. She does have dark red blood present in the vaginal vault with some white specks present.  Skin:    Capillary Refill: Capillary refill takes less than 2 seconds.  Neurological:      General: No focal deficit present.     Mental Status: She is alert and oriented to person, place, and time.     Cranial Nerves: No cranial nerve deficit.  Psychiatric:        Mood and Affect: Mood normal.        Behavior: Behavior normal.        Thought Content: Thought content normal.        Judgment: Judgment normal.         Assessment And Plan:  1. Vaginal discharge  Will check for STDs and bacterial vaginosis - Cervicovaginal ancillary only - HIV antibody (with reflex) - Beta HCG, Quant ([facility])  2. History of COVID-19  Overall doing well since that time  3. Abnormal vaginal bleeding  Will check upreg and beta hcg since has irregular menses  Pending results she may need a referral to GYN  Urine pregnancy is negative - POCT Urine Pregnancy - Beta HCG, Quant ([facility])   Arnette Felts, FNP    THE PATIENT IS ENCOURAGED TO PRACTICE SOCIAL DISTANCING DUE TO THE COVID-19 PANDEMIC.

## 2019-07-31 LAB — BETA HCG QUANT (REF LAB): hCG Quant: <1 m[IU]/mL

## 2019-08-01 LAB — CERVICOVAGINAL ANCILLARY ONLY
Bacterial Vaginitis (gardnerella): NEGATIVE
Candida Glabrata: NEGATIVE
Candida Vaginitis: POSITIVE — AB
Chlamydia: NEGATIVE
Comment: NEGATIVE
Comment: NEGATIVE
Comment: NEGATIVE
Comment: NEGATIVE
Comment: NEGATIVE
Comment: NORMAL
Neisseria Gonorrhea: NEGATIVE
Trichomonas: NEGATIVE

## 2019-08-01 MED ORDER — FLUCONAZOLE 100 MG PO TABS: 100.0000 mg | ORAL_TABLET | Freq: Every day | ORAL | 0 refills | Status: DC

## 2019-08-20 DIAGNOSIS — R69 Illness, unspecified: Secondary | ICD-10-CM | POA: Diagnosis not present

## 2019-09-01 ENCOUNTER — Encounter: Payer: Self-pay | Admitting: Nurse Practitioner

## 2019-09-02 ENCOUNTER — Other Ambulatory Visit: Payer: Self-pay

## 2019-09-02 ENCOUNTER — Ambulatory Visit (HOSPITAL_COMMUNITY)
Admission: EM | Admit: 2019-09-02 | Discharge: 2019-09-02 | Disposition: A | Discharge status: Home / Self Care | Payer: Medicare HMO | Attending: Family Medicine | Admitting: Family Medicine

## 2019-09-02 DIAGNOSIS — H6983 Other specified disorders of Eustachian tube, bilateral: Secondary | ICD-10-CM

## 2019-09-02 NOTE — Discharge Instructions (Signed)
Please try flonase  Please try an anti-histamine like zyrtec  You can try debrox to help loosen ear wax instead of using a q tip  Please follow up if your symptoms fail to improve or worsen.

## 2019-09-02 NOTE — ED Provider Notes (Signed)
[place]    CSN: [id number] Arrival date & time: 09/02/19  1757      History   Chief Complaint Chief Complaint  Patient presents with  . Ear Fullness    HPI TAWNY RASPBERRY is a 44 y.o. female.   She is presenting with ear fullness.  Denies any fevers.  Symptoms started earlier today.  HPI  Past Medical History:  Diagnosis Date  . Asthma   . GERD (gastroesophageal reflux disease)   . Lung abnormality    "right lung underdeveloped"    Patient Active Problem List   Diagnosis Date Noted  . ANEMIA, IRON DEFICIENCY, UNSPEC. 07/05/2006  . ASTHMA, UNSPECIFIED 07/05/2006  . GASTROESOPHAGEAL REFLUX, NO ESOPHAGITIS 07/05/2006    No past surgical history on file.  OB History   No obstetric history on file.      Home Medications    Prior to Admission medications   Medication Sig Start Date End Date Taking? Authorizing Provider  albuterol (PROVENTIL HFA;VENTOLIN HFA) 108 (90 Base) MCG/ACT inhaler Inhale 1-2 puffs into the lungs every 6 (six) hours as needed for wheezing or shortness of breath. 07/09/17   Zigmund Gottron, NP  benzonatate (TESSALON) 100 MG capsule Take 1 capsule (100 mg total) by mouth every 8 (eight) hours. 08/22/17   Tasia Catchings, Amy V, PA-C  budesonide-formoterol (SYMBICORT) 160-4.5 MCG/ACT inhaler Inhale 2 puffs into the lungs 2 (two) times daily. 02/26/17   Vanessa Kick, MD  Cetirizine HCl (ZYRTEC ALLERGY) 10 MG CAPS Take 1 capsule (10 mg total) by mouth daily. 08/22/17   Tasia Catchings, Amy V, PA-C  fluconazole (DIFLUCAN) 100 MG tablet Take 1 tablet (100 mg total) by mouth daily. Take 1 tablet by mouth now repeat in 5 days 08/01/19   Minette Brine, FNP  fluticasone [facility]) 50 MCG/ACT nasal spray Place 2 sprays into both nostrils daily. 08/22/17   Tasia Catchings, Amy V, PA-C  ipratropium (ATROVENT) 0.06 % nasal spray Place 2 sprays into both nostrils 4 (four) times daily. 08/22/17   Tasia Catchings, Amy V, PA-C  omeprazole (PRILOSEC) 10 MG capsule Take 10 mg by mouth as needed.     [provider]    Family History No family history on file.  Social History Social History   Tobacco Use  . Smoking status: Never Smoker  . Smokeless tobacco: Never Used  Substance Use Topics  . Alcohol use: No  . Drug use: No     Allergies   Aspirin, Morphine, Morphine and related, and Codeine   Review of Systems Review of Systems  See HPI  Physical Exam Triage Vital Signs ED Triage Vitals  Enc Vitals Group     BP 09/02/19 1833 (!) 145/90     Pulse Rate 09/02/19 1831 82     Resp 09/02/19 1831 16     Temp 09/02/19 1831 97.8 F (36.6 C)     Temp src --      SpO2 09/02/19 1831 98 %     Weight --      Height --      Head Circumference --      Peak Flow --      Pain Score 09/02/19 1832 0     Pain Loc --      Pain Edu? --      Excl. in [place]? --    No data found.  Updated Vital Signs BP (!) 145/90   Pulse 82   Temp 97.8 F (36.6 C)   Resp 16  LMP 07/30/2019   SpO2 98%   Visual Acuity Right Eye Distance:   Left Eye Distance:   Bilateral Distance:    Right Eye Near:   Left Eye Near:    Bilateral Near:     Physical Exam Gen: NAD, alert, cooperative with exam, well-appearing ENT: normal lips, normal nasal mucosa, tympanic membranes clear and intact bilaterally Eye: normal EOM, normal conjunctiva and lids CV:  no edema, +2 pedal pulses   Resp: no accessory muscle use, non-labored,     UC Treatments / Results  Labs (all labs ordered are listed, but only abnormal results are displayed) Labs Reviewed - No data to display  EKG   Radiology No results found.  Procedures Procedures (including critical care time)  Medications Ordered in UC Medications - No data to display  Initial Impression / Assessment and Plan / UC Course  I have reviewed the triage vital signs and the nursing notes.  Pertinent labs & imaging results that were available during my care of the patient were reviewed by me and considered in my medical decision making  (see chart for details).     Ms. Yamin is a 44 year old female is presenting with symptoms suggestive of eustachian tube dysfunction.  Counseled on supportive care.  Given indications to follow-up return.  Final Clinical Impressions(s) / UC Diagnoses   Final diagnoses:  Dysfunction of both eustachian tubes     Discharge Instructions     Please try flonase  Please try an anti-histamine like zyrtec  You can try debrox to help loosen ear wax instead of using a q tip  Please follow up if your symptoms fail to improve or worsen.     ED Prescriptions    None     PDMP not reviewed this encounter.   Myra Rude, MD 09/02/19 2128

## 2019-09-02 NOTE — ED Triage Notes (Signed)
Pt woke up feeling like water was in her right ear. Has had to have ears irrigated before

## 2019-09-12 DIAGNOSIS — J4550 Severe persistent asthma, uncomplicated: Secondary | ICD-10-CM | POA: Diagnosis not present

## 2019-09-18 ENCOUNTER — Ambulatory Visit
Admission: RE | Admit: 2019-09-18 | Discharge: 2019-09-18 | Disposition: A | Discharge status: Home / Self Care | Payer: BC Managed Care – PPO | Source: Ambulatory Visit | Attending: Registered Nurse | Admitting: Registered Nurse

## 2019-09-18 ENCOUNTER — Other Ambulatory Visit: Payer: Self-pay | Admitting: Registered Nurse

## 2019-09-18 DIAGNOSIS — R0602 Shortness of breath: Secondary | ICD-10-CM

## 2019-10-16 ENCOUNTER — Other Ambulatory Visit: Payer: Self-pay

## 2019-10-16 ENCOUNTER — Encounter: Payer: Self-pay | Admitting: Nurse Practitioner

## 2019-10-16 ENCOUNTER — Other Ambulatory Visit (HOSPITAL_COMMUNITY)
Admission: RE | Admit: 2019-10-16 | Discharge: 2019-10-16 | Disposition: A | Discharge status: Home / Self Care | Payer: BC Managed Care – PPO | Source: Ambulatory Visit | Attending: Nurse Practitioner | Admitting: Nurse Practitioner

## 2019-10-16 ENCOUNTER — Ambulatory Visit (INDEPENDENT_AMBULATORY_CARE_PROVIDER_SITE_OTHER): Payer: BC Managed Care – PPO | Admitting: Nurse Practitioner

## 2019-10-16 VITALS — BP 122/78 | HR 94 | Temp 97.3°F | Ht 65.4 in | Wt 256.6 lb

## 2019-10-16 DIAGNOSIS — Z01419 Encounter for gynecological examination (general) (routine) without abnormal findings: Secondary | ICD-10-CM | POA: Insufficient documentation

## 2019-10-16 DIAGNOSIS — R69 Illness, unspecified: Secondary | ICD-10-CM | POA: Diagnosis not present

## 2019-10-16 DIAGNOSIS — Z1159 Encounter for screening for other viral diseases: Secondary | ICD-10-CM

## 2019-10-16 DIAGNOSIS — Z113 Encounter for screening for infections with a predominantly sexual mode of transmission: Secondary | ICD-10-CM | POA: Diagnosis not present

## 2019-10-16 DIAGNOSIS — E559 Vitamin D deficiency, unspecified: Secondary | ICD-10-CM | POA: Diagnosis not present

## 2019-10-16 DIAGNOSIS — N898 Other specified noninflammatory disorders of vagina: Secondary | ICD-10-CM

## 2019-10-16 DIAGNOSIS — Z6841 Body Mass Index (BMI) 40.0 and over, adult: Secondary | ICD-10-CM | POA: Diagnosis not present

## 2019-10-16 DIAGNOSIS — Z0000 Encounter for general adult medical examination without abnormal findings: Secondary | ICD-10-CM | POA: Diagnosis not present

## 2019-10-16 DIAGNOSIS — Z833 Family history of diabetes mellitus: Secondary | ICD-10-CM

## 2019-10-16 NOTE — Progress Notes (Signed)
This visit occurred during the SARS-CoV-2 public health emergency.  Safety protocols were in place, including screening questions prior to the visit, additional usage of staff PPE, and extensive cleaning of exam room while observing appropriate contact time as indicated for disinfecting solutions.  Subjective:     Patient ID: Heather Harding , female    DOB: 1976-02-04 , 44 y.o.   MRN: [id number]   Chief Complaint  Patient presents with  . Annual Exam    physical w/pap    HPI  Here for HM   The patient states she uses none for birth control. Last LMP was Patient's last menstrual period was 10/06/2019.. Negative for Dysmenorrhea and Negative for Menorrhagia Mammogram never done.  Negative for: breast discharge, breast lump(s), breast pain and breast self exam.  Pertinent negatives include abnormal bleeding (hematology), anxiety, decreased libido, depression, difficulty falling sleep, dyspareunia, history of infertility, nocturia, sexual dysfunction, sleep disturbances, urinary incontinence, urinary urgency, vaginal discharge. She is complaining today with vaginal itching.  She is sexually active (men).  Diet regular.  The patient states her exercise level is minimal - she will try to get her steps in with walking with a goal of 5,000 occasionally will have 7,000 steps.  Her ultimate goal is 10,000 steps per day.    She is not working from home. She had covid in January 2021, after her 10 days she then had shortness of breath. She is now just getting to where she is not doing daily breathing treatments.  She feels like her brain function will be harder to find words, better now.  Still a small amount of brain fog.  She reports her Pulmonologist at [facility] states she does not need to get the covid vaccine at this time due to her having the antibodies". She tested positive for up to a month after having covid  The patient's tobacco use is:  Social History   Tobacco Use  Smoking Status Never Smoker   Smokeless Tobacco Never Used   She has been exposed to passive smoke. The patient's alcohol use is:  Social History   Substance and Sexual Activity  Alcohol Use No   Additional information: Last PAP done several years ago.  next one scheduled for today  Past Medical History:  Diagnosis Date  . Asthma   . GERD (gastroesophageal reflux disease)   . Lung abnormality    "right lung underdeveloped"     No family history on file.   Current Outpatient Medications:  .  albuterol (PROVENTIL HFA;VENTOLIN HFA) 108 (90 Base) MCG/ACT inhaler, Inhale 1-2 puffs into the lungs every 6 (six) hours as needed for wheezing or shortness of breath., Disp: 1 Inhaler, Rfl: 0 .  budesonide-formoterol (SYMBICORT) 160-4.5 MCG/ACT inhaler, Inhale 2 puffs into the lungs 2 (two) times daily., Disp: 1 Inhaler, Rfl: 1 .  Cetirizine HCl (ZYRTEC ALLERGY) 10 MG CAPS, Take 1 capsule (10 mg total) by mouth daily., Disp: 30 capsule, Rfl: 0 .  omeprazole (PRILOSEC) 10 MG capsule, Take 10 mg by mouth as needed., Disp: , Rfl:  .  benzonatate (TESSALON) 100 MG capsule, Take 1 capsule (100 mg total) by mouth every 8 (eight) hours. (Patient not taking: Reported on 10/16/2019), Disp: 21 capsule, Rfl: 0 .  fluconazole (DIFLUCAN) 100 MG tablet, Take 1 tablet (100 mg total) by mouth daily. Take 1 tablet by mouth now repeat in 5 days (Patient not taking: Reported on 10/16/2019), Disp: 2 tablet, Rfl: 0 .  fluticasone (FLONASE) 50 MCG/ACT nasal  spray, Place 2 sprays into both nostrils daily. (Patient not taking: Reported on 10/16/2019), Disp: 1 g, Rfl: 0 .  ipratropium (ATROVENT) 0.06 % nasal spray, Place 2 sprays into both nostrils 4 (four) times daily. (Patient not taking: Reported on 10/16/2019), Disp: 15 mL, Rfl: 0   Allergies  Allergen Reactions  . Aspirin Other (See Comments)    Other Reaction: RYE  . Morphine Other (See Comments)    Family hx of allergy  . Morphine And Related   . Codeine Rash     Review of Systems   Constitutional: Negative.   HENT: Negative.   Eyes: Negative.   Respiratory: Negative.   Cardiovascular: Negative.  Negative for chest pain, palpitations and leg swelling.  Gastrointestinal: Negative.   Endocrine: Negative.   Genitourinary: Negative.   Musculoskeletal: Negative.   Skin: Negative.   Allergic/Immunologic: Negative.   Neurological: Negative.   Hematological: Negative.   Psychiatric/Behavioral: Negative.      Today's Vitals   10/16/19 1410  BP: 122/78  Pulse: 94  Temp: (!) 97.3 F (36.3 C)  TempSrc: Oral  SpO2: 98%  Weight: 256 lb 9.6 oz (116.4 kg)  Height: 5' 5.4" (1.661 m)  PainSc: 0-No pain   Body mass index is 42.18 kg/m.   Objective:  Physical Exam Vitals reviewed.  Constitutional:      General: She is not in acute distress.    Appearance: Normal appearance. She is well-developed.  HENT:     Head: Normocephalic and atraumatic.     Right Ear: Hearing, tympanic membrane, ear canal and external ear normal.     Left Ear: Hearing, tympanic membrane, ear canal and external ear normal.     Nose:     Comments: Deferred - masked    Mouth/Throat:     Comments: Deferred - masked Eyes:     General: Lids are normal.     Extraocular Movements: Extraocular movements intact.     Conjunctiva/sclera: Conjunctivae normal.     Pupils: Pupils are equal, round, and reactive to light.     Funduscopic exam:    Right eye: No papilledema.        Left eye: No papilledema.  Neck:     Thyroid: No thyroid mass.     Vascular: No carotid bruit.  Cardiovascular:     Rate and Rhythm: Normal rate and regular rhythm.     Pulses: Normal pulses.     Heart sounds: Normal heart sounds. No murmur heard.   Pulmonary:     Effort: Pulmonary effort is normal.     Breath sounds: Normal breath sounds.  Abdominal:     General: Abdomen is flat. Bowel sounds are normal.     Palpations: Abdomen is soft.  Genitourinary:    General: Normal vulva.     Exam position: Lithotomy  position.     Labia:        Right: No rash.        Left: No rash.      Vagina: Normal.     Cervix: Normal.     Uterus: Deviated (to the right).      Adnexa: Right adnexa normal and left adnexa normal.       Right: No mass or tenderness.         Left: No mass or tenderness.    Musculoskeletal:        General: No swelling. Normal range of motion.     Cervical back: Full passive range of motion without pain,  normal range of motion and neck supple.     Right lower leg: No edema.     Left lower leg: No edema.  Skin:    General: Skin is warm and dry.     Capillary Refill: Capillary refill takes less than 2 seconds.  Neurological:     General: No focal deficit present.     Mental Status: She is alert and oriented to person, place, and time.     Cranial Nerves: No cranial nerve deficit.     Sensory: No sensory deficit.  Psychiatric:        Mood and Affect: Mood normal.        Behavior: Behavior normal.        Thought Content: Thought content normal.        Judgment: Judgment normal.         Assessment And Plan:     1. Health maintenance examination . Behavior modifications discussed and diet history reviewed.   . Pt will continue to exercise regularly and modify diet with low GI, plant based foods and decrease intake of processed foods.  . Recommend intake of daily multivitamin, Vitamin D, and calcium.  . Recommend mammogram (refuses advised to ensure she is checking her breast monthly for changes/concerns), also refuses covid vaccine and TDAP for preventive screenings, as well as recommend immunizations that include influenza, TDAP (declined). She also refused covid vaccine, education provided. - CBC - CMP14+EGFR - Lipid panel  2. Encounter for hepatitis C screening test for low risk patient  Will check for Hepatitis C screening due to recent new recommendations to screen adults 4 - 21 years old - Hepatitis C antibody  3. Encounter for gynecological examination  Uterus is  deviated to the right   Slightly thick white discharge present to vaginal vault - Cytology -Pap Smear  4. Vitamin D deficiency  Reported history of vitamin d deficiency   Will check vitamin d level - VITAMIN D 25 Hydroxy (Vit-D Deficiency, Fractures)  5. Class 3 severe obesity due to excess calories without serious comorbidity with body mass index (BMI) of 40.0 to 44.9 in adult [facility])  Chronic,   Encouraged to walk at least 10,000 steps per day  Increase water intake  She would like high dose vitamin B12   6. Screening examination for STD (sexually transmitted disease)  - HIV Antibody (routine testing w rflx)  7. Family history of diabetes mellitus  - Hemoglobin A1c  8. Vaginal discharge  Will send swab to check for STDs and treat with diflucan    Minette Brine, FNP    THE PATIENT IS ENCOURAGED TO PRACTICE SOCIAL DISTANCING DUE TO THE COVID-19 PANDEMIC.

## 2019-10-16 NOTE — Patient Instructions (Addendum)
Health Maintenance, Female Adopting a healthy lifestyle and getting preventive care are important in promoting health and wellness. Ask your health care provider about:  The right schedule for you to have regular tests and exams.  Things you can do on your own to prevent diseases and keep yourself healthy. What should I know about diet, weight, and exercise? Eat a healthy diet   Eat a diet that includes plenty of vegetables, fruits, low-fat dairy products, and lean protein.  Do not eat a lot of foods that are high in solid fats, added sugars, or sodium. Maintain a healthy weight Body mass index (BMI) is used to identify weight problems. It estimates body fat based on height and weight. Your health care provider can help determine your BMI and help you achieve or maintain a healthy weight. Get regular exercise Get regular exercise. This is one of the most important things you can do for your health. Most adults should:  Exercise for at least 150 minutes each week. The exercise should increase your heart rate and make you sweat (moderate-intensity exercise).  Do strengthening exercises at least twice a week. This is in addition to the moderate-intensity exercise.  Spend less time sitting. Even light physical activity can be beneficial. Watch cholesterol and blood lipids Have your blood tested for lipids and cholesterol at 43 years of age, then have this test every 5 years. Have your cholesterol levels checked more often if:  Your lipid or cholesterol levels are high.  You are older than 43 years of age.  You are at high risk for heart disease. What should I know about cancer screening? Depending on your health history and family history, you may need to have cancer screening at various ages. This may include screening for:  Breast cancer.  Cervical cancer.  Colorectal cancer.  Skin cancer.  Lung cancer. What should I know about heart disease, diabetes, and high blood  pressure? Blood pressure and heart disease  High blood pressure causes heart disease and increases the risk of stroke. This is more likely to develop in people who have high blood pressure readings, are of African descent, or are overweight.  Have your blood pressure checked: ? Every 3-5 years if you are 18-39 years of age. ? Every year if you are 40 years old or older. Diabetes Have regular diabetes screenings. This checks your fasting blood sugar level. Have the screening done:  Once every three years after age 40 if you are at a normal weight and have a low risk for diabetes.  More often and at a younger age if you are overweight or have a high risk for diabetes. What should I know about preventing infection? Hepatitis B If you have a higher risk for hepatitis B, you should be screened for this virus. Talk with your health care provider to find out if you are at risk for hepatitis B infection. Hepatitis C Testing is recommended for:  Everyone born from 1945 through 1965.  Anyone with known risk factors for hepatitis C. Sexually transmitted infections (STIs)  Get screened for STIs, including gonorrhea and chlamydia, if: ? You are sexually active and are younger than 43 years of age. ? You are older than 43 years of age and your health care provider tells you that you are at risk for this type of infection. ? Your sexual activity has changed since you were last screened, and you are at increased risk for chlamydia or gonorrhea. Ask your health care provider if   you are at risk.  Ask your health care provider about whether you are at high risk for HIV. Your health care provider may recommend a prescription medicine to help prevent HIV infection. If you choose to take medicine to prevent HIV, you should first get tested for HIV. You should then be tested every 3 months for as long as you are taking the medicine. Pregnancy  If you are about to stop having your period (premenopausal) and  you may become pregnant, seek counseling before you get pregnant.  Take 400 to 800 micrograms (mcg) of folic acid every day if you become pregnant.  Ask for birth control (contraception) if you want to prevent pregnancy. Osteoporosis and menopause Osteoporosis is a disease in which the bones lose minerals and strength with aging. This can result in bone fractures. If you are 65 years old or older, or if you are at risk for osteoporosis and fractures, ask your health care provider if you should:  Be screened for bone loss.  Take a calcium or vitamin D supplement to lower your risk of fractures.  Be given hormone replacement therapy (HRT) to treat symptoms of menopause. Follow these instructions at home: Lifestyle  Do not use any products that contain nicotine or tobacco, such as cigarettes, e-cigarettes, and chewing tobacco. If you need help quitting, ask your health care provider.  Do not use street drugs.  Do not share needles.  Ask your health care provider for help if you need support or information about quitting drugs. Alcohol use  Do not drink alcohol if: ? Your health care provider tells you not to drink. ? You are pregnant, may be pregnant, or are planning to become pregnant.  If you drink alcohol: ? Limit how much you use to 0-1 drink a day. ? Limit intake if you are breastfeeding.  Be aware of how much alcohol is in your drink. In the [place] one drink equals one 12 oz bottle of beer (355 mL), one 5 oz glass of wine (148 mL), or one 1 oz glass of hard liquor (44 mL). General instructions  Schedule regular health, dental, and eye exams.  Stay current with your vaccines.  Tell your health care provider if: ? You often feel depressed. ? You have ever been abused or do not feel safe at home. Summary  Adopting a healthy lifestyle and getting preventive care are important in promoting health and wellness.  Follow your health care provider's instructions about healthy  diet, exercising, and getting tested or screened for diseases.  Follow your health care provider's instructions on monitoring your cholesterol and blood pressure. This information is not intended to replace advice given to you by your health care provider. Make sure you discuss any questions you have with your health care provider. Document Revised: 04/17/2018 Document Reviewed: 04/17/2018 Elsevier Patient Education  2020 Elsevier Inc.   COVID-19 Vaccine Information can be found at: [email or web address] For questions related to vaccine distribution or appointments, please email [email or web address] or call [phone].    

## 2019-10-17 ENCOUNTER — Encounter: Payer: Self-pay | Admitting: Nurse Practitioner

## 2019-10-17 ENCOUNTER — Other Ambulatory Visit: Payer: Self-pay | Admitting: Nurse Practitioner

## 2019-10-17 ENCOUNTER — Ambulatory Visit: Payer: Self-pay

## 2019-10-17 LAB — VITAMIN D 25 HYDROXY (VIT D DEFICIENCY, FRACTURES): Vit D, 25-Hydroxy: 26.8 ng/mL — ABNORMAL LOW (ref 30.0–100.0)

## 2019-10-17 LAB — HEMOGLOBIN A1C
Est. average glucose Bld gHb Est-mCnc: 105 mg/dL
Hgb A1c MFr Bld: 5.3 % (ref 4.8–5.6)

## 2019-10-17 LAB — LIPID PANEL
Chol/HDL Ratio: 2.8 ratio (ref 0.0–4.4)
Cholesterol, Total: 190 mg/dL (ref 100–199)
HDL: 69 mg/dL (ref >39)
LDL Chol Calc (NIH): 100 mg/dL — ABNORMAL HIGH (ref 0–99)
Triglycerides: 120 mg/dL (ref 0–149)
VLDL Cholesterol Cal: 21 mg/dL (ref 5–40)

## 2019-10-17 LAB — CMP14+EGFR
ALT: 24 IU/L (ref 0–32)
AST: 29 IU/L (ref 0–40)
Albumin/Globulin Ratio: 1.5 (ref 1.2–2.2)
Albumin: 4.3 g/dL (ref 3.8–4.8)
Alkaline Phosphatase: 64 IU/L (ref 48–121)
BUN/Creatinine Ratio: 9 (ref 9–23)
BUN: 7 mg/dL (ref 6–24)
Bilirubin Total: 0.2 mg/dL (ref 0.0–1.2)
CO2: 24 mmol/L (ref 20–29)
Calcium: 9.2 mg/dL (ref 8.7–10.2)
Chloride: 103 mmol/L (ref 96–106)
Creatinine, Ser: 0.82 mg/dL (ref 0.57–1.00)
GFR calc Af Amer: 101 mL/min/{1.73_m2} (ref >59)
GFR calc non Af Amer: 88 mL/min/{1.73_m2} (ref >59)
Globulin, Total: 2.8 g/dL (ref 1.5–4.5)
Glucose: 92 mg/dL (ref 65–99)
Potassium: 4 mmol/L (ref 3.5–5.2)
Sodium: 138 mmol/L (ref 134–144)
Total Protein: 7.1 g/dL (ref 6.0–8.5)

## 2019-10-17 LAB — CBC
Hematocrit: 36.7 % (ref 34.0–46.6)
Hemoglobin: 11.7 g/dL (ref 11.1–15.9)
MCH: 25.1 pg — ABNORMAL LOW (ref 26.6–33.0)
MCHC: 31.9 g/dL (ref 31.5–35.7)
MCV: 79 fL (ref 79–97)
Platelets: 275 10*3/uL (ref 150–450)
RBC: 4.66 x10E6/uL (ref 3.77–5.28)
RDW: 15.7 % — ABNORMAL HIGH (ref 11.7–15.4)
WBC: 6.4 10*3/uL (ref 3.4–10.8)

## 2019-10-17 LAB — HEPATITIS C ANTIBODY: Hep C Virus Ab: 0.3 s/co ratio (ref 0.0–0.9)

## 2019-10-17 LAB — HIV ANTIBODY (ROUTINE TESTING W REFLEX): HIV Screen 4th Generation wRfx: NONREACTIVE

## 2019-10-17 MED ORDER — VITAMIN D (ERGOCALCIFEROL) 1.25 MG (50000 UNIT) PO CAPS: 50000.0000 [IU] | ORAL_CAPSULE | ORAL | 0 refills | Status: DC

## 2019-10-17 NOTE — Chronic Care Management (AMB) (Signed)
  Care Management   Outreach Note  10/17/2019 Name: Heather Harding MRN: [id number] DOB: March 01, 1976  SW placed a successful outbound call to the patient in response to a voice message received requesting a call from SW. The patient reported she is interested in locating a provider who is "holistic" but she has had a hard time finding one. The patient gave an example of Googling "holistic gynecologist" but only being shown results for all gynecologists.   SW provided the patient with the contact number to the [facility] physician referral service [phone]) to speak with a representative who may help assist the patient.   Bevelyn Ngo, BSW, CDP Social Worker, Certified Dementia Practitioner [facility] Care Management [phone]

## 2019-10-17 NOTE — Telephone Encounter (Signed)
Have Heather Harding to add the covid antibodies to her labs done this week if possible.

## 2019-10-20 LAB — CERVICOVAGINAL ANCILLARY ONLY
Bacterial Vaginitis (gardnerella): POSITIVE — AB
Candida Glabrata: NEGATIVE
Candida Vaginitis: NEGATIVE
Chlamydia: NEGATIVE
Comment: NEGATIVE
Comment: NEGATIVE
Comment: NEGATIVE
Comment: NEGATIVE
Comment: NEGATIVE
Comment: NORMAL
Neisseria Gonorrhea: NEGATIVE
Trichomonas: NEGATIVE

## 2019-10-20 LAB — CYTOLOGY - PAP: Diagnosis: NEGATIVE

## 2019-10-22 ENCOUNTER — Telehealth: Payer: Self-pay

## 2019-10-22 ENCOUNTER — Encounter: Payer: Self-pay | Admitting: Nurse Practitioner

## 2019-10-22 NOTE — Telephone Encounter (Signed)
Good afternoon [name],   Dr. Christell Constant is currently out of the office until Monday, I will go ahead and forward your message to her but it will be sometime next week that you will receive a response. Thank you ===View-only below this line===   ----- Message -----      From:Heather Harding      Sent:10/22/2019  1:16 PM EDT        MQ:KMMNOT Christell Constant, FNP   Subject:Inquiry...  Good Afternoon Dr. Christell Constant,  I apologize for not asking about this during my physical.   Is there a birth control option you could recommend someone my age? I want something that's not going to have my hormones out of wack or make me gain weight. Also, if there's an option I could use that "IF" I ever decide to have a baby, then it would be possible.  Any suggestions?

## 2019-10-23 ENCOUNTER — Other Ambulatory Visit: Payer: Self-pay | Admitting: Nurse Practitioner

## 2019-10-23 MED ORDER — METRONIDAZOLE 500 MG PO TABS
500.0000 mg | ORAL_TABLET | Freq: Three times a day (TID) | ORAL | 0 refills | Status: AC
Start: 2019-10-23 — End: 2019-11-02

## 2019-10-23 NOTE — Progress Notes (Signed)
Rx for flagyl sent in

## 2019-10-27 LAB — SPECIMEN STATUS REPORT

## 2019-10-27 LAB — SARS-COV-2 ANTIBODY, IGM: SARS-CoV-2 Antibody, IgM: NEGATIVE

## 2019-10-27 LAB — SAR COV2 SEROLOGY (COVID19)AB(IGG),IA: DiaSorin SARS-CoV-2 Ab, IgG: POSITIVE

## 2019-10-27 LAB — SARS-COV-2 ANTIBODY, IGA: SARS-CoV-2 Antibody, IgA: NEGATIVE

## 2019-11-24 ENCOUNTER — Encounter: Payer: Self-pay | Admitting: Nurse Practitioner

## 2019-11-26 ENCOUNTER — Encounter: Payer: Self-pay | Admitting: Nurse Practitioner

## 2019-11-26 ENCOUNTER — Ambulatory Visit (INDEPENDENT_AMBULATORY_CARE_PROVIDER_SITE_OTHER): Payer: BC Managed Care – PPO | Admitting: Nurse Practitioner

## 2019-11-26 ENCOUNTER — Ambulatory Visit
Admission: RE | Admit: 2019-11-26 | Discharge: 2019-11-26 | Disposition: A | Discharge status: Home / Self Care | Payer: BC Managed Care – PPO | Source: Ambulatory Visit | Attending: Nurse Practitioner | Admitting: Nurse Practitioner

## 2019-11-26 ENCOUNTER — Other Ambulatory Visit: Payer: Self-pay

## 2019-11-26 VITALS — BP 140/96 | HR 97 | Temp 98.2°F | Ht 64.2 in | Wt 245.8 lb

## 2019-11-26 DIAGNOSIS — M25562 Pain in left knee: Secondary | ICD-10-CM

## 2019-11-26 DIAGNOSIS — Z01419 Encounter for gynecological examination (general) (routine) without abnormal findings: Secondary | ICD-10-CM | POA: Diagnosis not present

## 2019-11-26 MED ORDER — DICLOFENAC SODIUM 1 % EX GEL: 2.0000 g | Freq: Four times a day (QID) | CUTANEOUS | 1 refills | Status: DC

## 2019-11-26 NOTE — Progress Notes (Signed)
This visit occurred during the SARS-CoV-2 public health emergency. Safety protocols were in place, including screening questions prior to the visit, additional usage of staff PPE, and extensive cleaning of exam room while observing appropriate contact time as indicated for disinfecting solutions.  Subjective:     Patient ID: Heather Harding , female    DOB: 08-22-1975 , 44 y.o.   MRN: [id number]   Chief Complaint  Patient presents with  . Knee Pain    patient stated her left knee has been hurting for a month she said it hurts to bend it     HPI  Unable to extend or bend without discomfort.  She is having difficulty with sleeping due to the pain. She is walking with guarding.  At times when she walks it will improve.  Wt Readings from Last 3 Encounters: 11/26/19 : 245 lb 12.8 oz (111.5 kg) 10/16/19 : 256 lb 9.6 oz (116.4 kg) 07/30/19 : 262 lb 6.4 oz (119 kg)  She has been walking more.  She is interested in birth control pills.     Knee Pain  The incident occurred more than 1 week ago. There was no injury mechanism. The pain is present in the left knee. The quality of the pain is described as aching. Pertinent negatives include no inability to bear weight (the more weight she puts on it will have pain), muscle weakness, numbness or tingling. She reports no foreign bodies present. She has tried nothing for the symptoms. The treatment provided no relief.     Past Medical History:  Diagnosis Date  . Asthma   . GERD (gastroesophageal reflux disease)   . Lung abnormality    "right lung underdeveloped"     History reviewed. No pertinent family history.   Current Outpatient Medications:  .  albuterol (PROVENTIL HFA;VENTOLIN HFA) 108 (90 Base) MCG/ACT inhaler, Inhale 1-2 puffs into the lungs every 6 (six) hours as needed for wheezing or shortness of breath., Disp: 1 Inhaler, Rfl: 0 .  budesonide-formoterol (SYMBICORT) 160-4.5 MCG/ACT inhaler, Inhale 2 puffs into the lungs 2 (two)  times daily., Disp: 1 Inhaler, Rfl: 1 .  Cetirizine HCl (ZYRTEC ALLERGY) 10 MG CAPS, Take 1 capsule (10 mg total) by mouth daily., Disp: 30 capsule, Rfl: 0 .  Ferrous Sulfate (IRON PO), Take by mouth. Take one tablet daily, Disp: , Rfl:  .  omeprazole (PRILOSEC) 10 MG capsule, Take 10 mg by mouth as needed., Disp: , Rfl:  .  Vitamin D, Ergocalciferol, (DRISDOL) 1.25 MG (50000 UNIT) CAPS capsule, Take 1 capsule (50,000 Units total) by mouth every 7 (seven) days., Disp: 12 capsule, Rfl: 0 .  diclofenac Sodium (VOLTAREN) 1 % GEL, Apply 2 g topically 4 (four) times daily., Disp: 100 g, Rfl: 1   Allergies  Allergen Reactions  . Aspirin Other (See Comments)    Other Reaction: RYE  . Morphine Other (See Comments)    Family hx of allergy  . Morphine And Related   . Codeine Rash     Review of Systems  Constitutional: Negative.   Respiratory: Negative.   Cardiovascular: Negative.  Negative for chest pain, palpitations and leg swelling.  Neurological: Negative for dizziness, tingling and numbness.  Psychiatric/Behavioral: Negative.      Today's Vitals   11/26/19 1426  BP: (!) 140/96  Pulse: 97  Temp: 98.2 F (36.8 C)  TempSrc: Oral  Weight: 245 lb 12.8 oz (111.5 kg)  Height: 5' 4.2" (1.631 m)  PainSc: 0-No pain   Body  mass index is 41.93 kg/m.   Objective:  Physical Exam Constitutional:      General: She is not in acute distress.    Appearance: Normal appearance.  Cardiovascular:     Rate and Rhythm: Normal rate and regular rhythm.     Pulses: Normal pulses.     Heart sounds: Normal heart sounds. No murmur heard.   Pulmonary:     Effort: Pulmonary effort is normal. No respiratory distress.     Breath sounds: Normal breath sounds.  Musculoskeletal:        General: Tenderness (left knee pain) present. No swelling.  Neurological:     General: No focal deficit present.     Mental Status: She is alert and oriented to person, place, and time.     Cranial Nerves: No cranial  nerve deficit.  Psychiatric:        Mood and Affect: Mood normal.        Behavior: Behavior normal.        Thought Content: Thought content normal.        Judgment: Judgment normal.         Assessment And Plan:     1. Acute pain of left knee Comments: - will send for xray - tenderness and slight swelling to anterior nee. - wear knee brace when possible - DG Knee Complete 4 Views Left; Future - diclofenac Sodium (VOLTAREN) 1 % GEL; Apply 2 g topically 4 (four) times daily.  Dispense: 100 g; Refill: 1  2. Encounter for gynecological examination Comments: - would like to discuss birth control and infertility. - Ambulatory referral to Obstetrics / Gynecology  She received lipo B today in office.     Patient was given opportunity to ask questions. Patient verbalized understanding of the plan and was able to repeat key elements of the plan. All questions were answered to their satisfaction.  Arnette Felts, FNP   I, Arnette Felts, FNP, have reviewed all documentation for this visit. The documentation on 11/26/19 for the exam, diagnosis, procedures, and orders are all accurate and complete.  THE PATIENT IS ENCOURAGED TO PRACTICE SOCIAL DISTANCING DUE TO THE COVID-19 PANDEMIC.

## 2019-11-26 NOTE — Patient Instructions (Signed)
   Avoid jumping on knee with exercise.   Go to [facility] for knee xray

## 2019-12-03 ENCOUNTER — Telehealth: Payer: Self-pay

## 2019-12-03 ENCOUNTER — Other Ambulatory Visit: Payer: Self-pay | Admitting: Nurse Practitioner

## 2019-12-03 DIAGNOSIS — M25562 Pain in left knee: Secondary | ICD-10-CM

## 2019-12-03 NOTE — Telephone Encounter (Signed)
Patient called requesting on MRI on her knee and also wanted to let you know that she is dropping off some FMLA paperwork for her knee soon. YL,RMA

## 2019-12-09 ENCOUNTER — Other Ambulatory Visit: Payer: Self-pay

## 2019-12-09 DIAGNOSIS — M25562 Pain in left knee: Secondary | ICD-10-CM

## 2019-12-29 ENCOUNTER — Other Ambulatory Visit: Payer: BC Managed Care – PPO

## 2020-01-06 ENCOUNTER — Other Ambulatory Visit: Payer: Self-pay

## 2020-01-06 MED ORDER — VITAMIN D (ERGOCALCIFEROL) 1.25 MG (50000 UNIT) PO CAPS: 50000.0000 [IU] | ORAL_CAPSULE | ORAL | 0 refills | Status: DC

## 2020-02-04 ENCOUNTER — Encounter: Payer: Self-pay | Admitting: Nurse Practitioner

## 2020-02-11 ENCOUNTER — Other Ambulatory Visit (HOSPITAL_COMMUNITY)
Admission: RE | Admit: 2020-02-11 | Discharge: 2020-02-11 | Disposition: A | Discharge status: Home / Self Care | Payer: BC Managed Care – PPO | Source: Ambulatory Visit | Attending: Obstetrics & Gynecology | Admitting: Obstetrics & Gynecology

## 2020-02-11 ENCOUNTER — Ambulatory Visit (INDEPENDENT_AMBULATORY_CARE_PROVIDER_SITE_OTHER): Payer: BC Managed Care – PPO | Admitting: Obstetrics & Gynecology

## 2020-02-11 ENCOUNTER — Encounter: Payer: Self-pay | Admitting: Obstetrics & Gynecology

## 2020-02-11 ENCOUNTER — Other Ambulatory Visit: Payer: Self-pay

## 2020-02-11 VITALS — BP 122/80 | HR 78 | Ht 65.0 in | Wt 241.0 lb

## 2020-02-11 DIAGNOSIS — N898 Other specified noninflammatory disorders of vagina: Secondary | ICD-10-CM | POA: Insufficient documentation

## 2020-02-11 DIAGNOSIS — L689 Hypertrichosis, unspecified: Secondary | ICD-10-CM

## 2020-02-11 DIAGNOSIS — Z1231 Encounter for screening mammogram for malignant neoplasm of breast: Secondary | ICD-10-CM

## 2020-02-11 NOTE — Progress Notes (Signed)
Patient presents for vaginal discharge. Patient desires to talk about family planning- potentially would like to try to get pregnant. Armandina Stammer RN   Patient had pap smear in June 2021 with primary care provider

## 2020-02-11 NOTE — Progress Notes (Signed)
History:  44 y.o. G1P0010 here today for eval of freq BV. Pt is sexually active once a month and notices odor following this and her menses. Pt uses lots of scented products. She also reports excessive hair growth on her face. Uses tweezers and razor. Pt has monthly cycles. Wants to know if she could get pregnant. She is not attempting pregnancy at present.     The following portions of the patient's history were reviewed and updated as appropriate: allergies, current medications, past family history, past medical history, past social history, past surgical history and problem list.  Review of Systems:  Pertinent items are noted in HPI.    Objective:  Physical Exam Blood pressure 122/80, pulse 78, height 5\' 5"  (1.651 m), weight 249 lb (112.9 kg), last menstrual period 02/05/2020.  CONSTITUTIONAL: Well-developed, well-nourished female in no acute distress.  HENT:  Normocephalic, atraumatic. Some rough hair growth on chin and sides of face. No excessive scaring.   EYES: Conjunctivae and EOM are normal. No scleral icterus.  NECK: Normal range of motion SKIN: Skin is warm and dry. No rash noted. Not diaphoretic.No pallor. NEUROLGIC: Alert and oriented to person, place, and time. Normal coordination.   Pelvic: Normal appearing external genitalia; normal appearing vaginal mucosa and cervix.  Normal discharge.  Small uterus, no other palpable masses, no uterine or adnexal tenderness    Assessment & Plan:  Excessive hair growth  Reviewed conservative management options.   Vaginal discharge/ recurrent BV  Reviewed GO WHITE Breast cancer screen  screening mammogram  F/u in 3 months or sooner prn   Total face-to-face time with patient was 20 min.  Greater than 50% was spent in counseling and coordination of care with the patient.

## 2020-02-11 NOTE — Patient Instructions (Addendum)
GO WHITE: Soap: UNSCENTED Dove (white box light green writing) Laundry detergent (underwear)- Dreft or [employer] unscented WHITE 100% cotton panties (NOT just cotton crouch) Sanitary napkin/panty liners: UNSCENTED.  If it doesn't SAY unscented it can have a scent/perfume    NO PERFUMES OR LOTIONS OR POTIONS in the vulvar area (may use regular KY) Condoms: hypoallergenic only. Non dyed (no color) Toilet papers: white only Wash clothes: use a separate wash cloth. WHITE.  Washed in Dreft.    Hirsutism and Masculinization Hirsutism is when a female has an excessive amount of hair in an area where a female would typically have hair growth, such as on the face, chest, or back. Masculinization is when a female's body develops certain characteristics that are like a female's body. What are the causes? Although many things can cause these conditions, the most common cause is polycystic ovary syndrome (PCOS). Taking certain medicines, such as androgens and anabolic steroids, may also cause these conditions. What are the signs or symptoms? Hirsutism Symptoms of this condition include excess hair growth on the:  Face.  Chest.  Thighs.  Back. Masculinization Symptoms of this condition include:  Deepening voice.  Loss of breast tissue.  Increased muscle mass.  Thinning hair on the head (balding).  Irregular or loss of menstrual periods.  Enlarged clitoris. How is this diagnosed? This condition is diagnosed based on a health history, physical exam, and tests. Tests may include blood tests and imaging studies. Your health care provider may recommend that you follow up with specialists to understand the cause of your condition or to help treat your condition. How is this treated? This condition may be treated by:  Taking medicines to help control hair growth.  Making lifestyle changes to help reduce hormone levels that are contributing to your condition.  Removing unwanted hair by  shaving, using creams, or waxing. More permanent ways to remove hair include electrolysis and laser treatments. If your symptoms are caused by certain medicines, your health care provider may have you stop taking them. Follow these instructions at home:  Take over-the-counter and prescription medicines only as told by your health care provider.  Talk to your health care provider about your treatment options and what may be best for you.  Maintain a healthy weight. If needed, talk to your health care provider about how to lose weight.  Keep all follow-up visits as told by your health care provider. This is important. Contact a health care provider if:  Your symptoms suddenly get worse.  You develop acne.  You have irregular menstrual periods.  You stop having your period.  You feel a lump in your lower abdomen. Summary  Hirsutism is when a female has an excessive amount of hair in an area where a female would typically have hair growth, such as on the face, chest, thighs, or back.  Masculinization is when a female's body develops characteristics like a female's body. This may include a deepening voice, loss of breast tissue, increased muscle mass, thinning hair (balding), changes in menstrual periods, and clitoris growth.  The most common cause of hirsutism is polycystic ovary syndrome (PCOS).  Treatment options include removing unwanted hair, taking medicines, and making lifestyle changes. Talk to your health care provider about which treatments are best for you.  Your health care provider may refer to you to specialists to find the cause of your condition. This information is not intended to replace advice given to you by your health care provider. Make sure  you discuss any questions you have with your health care provider. Document Revised: 03/05/2018 Document Reviewed: 03/05/2018 Elsevier Patient Education  2020 [employer].

## 2020-02-13 LAB — CERVICOVAGINAL ANCILLARY ONLY
Bacterial Vaginitis (gardnerella): NEGATIVE
Candida Glabrata: NEGATIVE
Candida Vaginitis: NEGATIVE
Comment: NEGATIVE
Comment: NEGATIVE
Comment: NEGATIVE

## 2020-02-24 ENCOUNTER — Telehealth: Payer: Self-pay

## 2020-02-24 NOTE — Telephone Encounter (Signed)
I left a message asking the patient to call and schedule Medicare AWV w/ Herbie Saxon.

## 2020-03-03 ENCOUNTER — Ambulatory Visit: Payer: BC Managed Care – PPO | Admitting: Obstetrics & Gynecology

## 2020-04-21 ENCOUNTER — Ambulatory Visit: Payer: Medicare HMO | Admitting: Nurse Practitioner

## 2020-04-23 DIAGNOSIS — J4551 Severe persistent asthma with (acute) exacerbation: Secondary | ICD-10-CM | POA: Diagnosis not present

## 2020-04-23 DIAGNOSIS — J4550 Severe persistent asthma, uncomplicated: Secondary | ICD-10-CM | POA: Diagnosis not present

## 2020-04-28 ENCOUNTER — Other Ambulatory Visit: Payer: Self-pay | Admitting: Internal Medicine

## 2020-05-03 ENCOUNTER — Ambulatory Visit: Payer: Medicare HMO | Admitting: Nurse Practitioner

## 2020-05-05 ENCOUNTER — Other Ambulatory Visit: Payer: Self-pay

## 2020-05-05 ENCOUNTER — Encounter: Payer: Self-pay | Admitting: Nurse Practitioner

## 2020-05-05 ENCOUNTER — Ambulatory Visit (INDEPENDENT_AMBULATORY_CARE_PROVIDER_SITE_OTHER): Payer: BC Managed Care – PPO | Admitting: Nurse Practitioner

## 2020-05-05 VITALS — BP 122/80 | HR 77 | Temp 99.5°F | Ht 64.2 in | Wt 234.8 lb

## 2020-05-05 DIAGNOSIS — R5383 Other fatigue: Secondary | ICD-10-CM

## 2020-05-05 DIAGNOSIS — Z8616 Personal history of COVID-19: Secondary | ICD-10-CM

## 2020-05-05 DIAGNOSIS — R519 Headache, unspecified: Secondary | ICD-10-CM

## 2020-05-05 DIAGNOSIS — E559 Vitamin D deficiency, unspecified: Secondary | ICD-10-CM

## 2020-05-05 DIAGNOSIS — Z6841 Body Mass Index (BMI) 40.0 and over, adult: Secondary | ICD-10-CM | POA: Diagnosis not present

## 2020-05-05 DIAGNOSIS — Z2821 Immunization not carried out because of patient refusal: Secondary | ICD-10-CM

## 2020-05-05 NOTE — Progress Notes (Signed)
I,Yamilka Roman [employer] as a Neurosurgeon for [employer], FNP.,have documented all relevant documentation on the behalf of Arnette Felts, FNP,as directed by  Arnette Felts, FNP while in the presence of Arnette Felts, FNP. This visit occurred during the SARS-CoV-2 public health emergency.  Safety protocols were in place, including screening questions prior to the visit, additional usage of staff PPE, and extensive cleaning of exam room while observing appropriate contact time as indicated for disinfecting solutions.  Subjective:     Patient ID: Heather Harding , female    DOB: 1975-10-16 , 44 y.o.   MRN: [id number]   Chief Complaint  Patient presents with  . vitamin d recheck  . Weight Check    HPI  Patient here for a f/u on her vitamin d and weight.  She is exercising   Wt Readings from Last 3 Encounters: 05/05/20 : 234 lb 12.8 oz (106.5 kg) 02/11/20 : 241 lb (109.3 kg) 11/26/19 : 245 lb 12.8 oz (111.5 kg)      Past Medical History:  Diagnosis Date  . Asthma   . GERD (gastroesophageal reflux disease)   . Lung abnormality    "right lung underdeveloped"     Family History  Problem Relation Age of Onset  . Cancer Paternal Grandfather   . Stroke Maternal Grandmother   . Stroke Maternal Grandfather   . Diabetes Father   . Hypertension Sister      Current Outpatient Medications:  .  albuterol (PROVENTIL HFA;VENTOLIN HFA) 108 (90 Base) MCG/ACT inhaler, Inhale 1-2 puffs into the lungs every 6 (six) hours as needed for wheezing or shortness of breath., Disp: 1 Inhaler, Rfl: 0 .  budesonide-formoterol (SYMBICORT) 160-4.5 MCG/ACT inhaler, Inhale 2 puffs into the lungs 2 (two) times daily., Disp: 1 Inhaler, Rfl: 1 .  Cetirizine HCl (ZYRTEC ALLERGY) 10 MG CAPS, Take 1 capsule (10 mg total) by mouth daily., Disp: 30 capsule, Rfl: 0 .  Cyanocobalamin (VITAMIN B12 PO), Take 1 tablet by mouth daily at 12 noon., Disp: , Rfl:  .  Ferrous Sulfate (IRON PO), Take by mouth. Take one tablet  daily, Disp: , Rfl:  .  omeprazole (PRILOSEC) 10 MG capsule, Take 10 mg by mouth as needed., Disp: , Rfl:  .  Vitamin D, Ergocalciferol, (DRISDOL) 1.25 MG (50000 UNIT) CAPS capsule, TAKE 1 CAPSULE BY MOUTH ONCE A WEEK (EVERY  7  (SEVEN)  DAYS), Disp: 12 capsule, Rfl: 0   Allergies  Allergen Reactions  . Aspirin Other (See Comments)    Other Reaction: RYE  . Morphine Other (See Comments)    Family hx of allergy  . Morphine And Related   . Codeine Rash     Review of Systems  Constitutional: Negative.   Respiratory: Negative.   Cardiovascular: Negative.  Negative for chest pain, palpitations and leg swelling.  Psychiatric/Behavioral: Negative.      Today's Vitals   05/05/20 1406  BP: 122/80  Pulse: 77  Temp: 99.5 F (37.5 C)  TempSrc: Oral  Weight: 234 lb 12.8 oz (106.5 kg)  Height: 5' 4.2" (1.631 m)  PainSc: 0-No pain   Body mass index is 40.05 kg/m.   Objective:  Physical Exam Vitals reviewed.  Constitutional:      General: She is not in acute distress.    Appearance: Normal appearance. She is obese.  Cardiovascular:     Rate and Rhythm: Normal rate and regular rhythm.     Pulses: Normal pulses.     Heart sounds: Normal heart sounds.  No murmur heard.   Pulmonary:     Effort: Pulmonary effort is normal. No respiratory distress.     Breath sounds: Normal breath sounds. No wheezing.  Skin:    Capillary Refill: Capillary refill takes less than 2 seconds.  Neurological:     General: No focal deficit present.     Mental Status: She is alert and oriented to person, place, and time.     Cranial Nerves: No cranial nerve deficit.  Psychiatric:        Mood and Affect: Mood normal.        Behavior: Behavior normal.        Thought Content: Thought content normal.        Judgment: Judgment normal.         Assessment And Plan:     1. Class 3 severe obesity due to excess calories without serious comorbidity with body mass index (BMI) of 40.0 to 44.9 in adult  [facility])  Chronic, she has lost 11 lbs since her last office visit  Discussed healthy diet and regular exercise options   Encouraged to exercise at least 150 minutes per week with 2 days of strength training  2. Fatigue, unspecified type  Will check for metabolic causes - Vitamin B12 - TSH - FSH - LH - Lipid panel  3. Vitamin D deficiency  Will check vitamin D level and supplement as needed.  Continue with weekly vitamin d supplement   Also encouraged to spend 15 minutes in the sun daily.  - VITAMIN D 25 Hydroxy (Vit-D Deficiency, Fractures)  4. Influenza vaccination declined  Patient declined influenza vaccination at this time. Patient is aware that influenza vaccine prevents illness in 70% of healthy people, and reduces hospitalizations to 30-70% in elderly. This vaccine is recommended annually. Pt is willing to accept risk associated with refusing vaccination.  5. COVID-19 vaccination declined  Declines covid 19 vaccine. Discussed risk of covid 101 and if she changes her mind about the vaccine to call the office.  Encouraged to take multivitamin, vitamin d, vitamin c and zinc to increase immune system. Aware can call office if would like to have vaccine here at office.   6. History of COVID-19  She would like her antibodies checked, I have discussed with her again the importance of being vaccinated and to continue being safe with wearing mask, remaining 6 feet away from others and washing her hands. - SARS-CoV-2 Semi-Quantitative Total Antibody, Spike   7. Acute nonintractable headache, unspecified headache type  None currently however having more often prior to her menstrual cycles   Patient was given opportunity to ask questions. Patient verbalized understanding of the plan and was able to repeat key elements of the plan. All questions were answered to their satisfaction.  Arnette Felts, FNP    I, Arnette Felts, FNP, have reviewed all documentation for this visit. The  documentation on 05/05/20 for the exam, diagnosis, procedures, and orders are all accurate and complete.   THE PATIENT IS ENCOURAGED TO PRACTICE SOCIAL DISTANCING DUE TO THE COVID-19 PANDEMIC.

## 2020-05-06 LAB — LUTEINIZING HORMONE: LH: 44 m[IU]/mL

## 2020-05-06 LAB — VITAMIN D 25 HYDROXY (VIT D DEFICIENCY, FRACTURES): Vit D, 25-Hydroxy: 51.1 ng/mL (ref 30.0–100.0)

## 2020-05-06 LAB — VITAMIN B12: Vitamin B-12: >2000 pg/mL — ABNORMAL HIGH (ref 232–1245)

## 2020-05-06 LAB — LIPID PANEL
Chol/HDL Ratio: 2.8 ratio (ref 0.0–4.4)
Cholesterol, Total: 204 mg/dL — ABNORMAL HIGH (ref 100–199)
HDL: 72 mg/dL (ref >39)
LDL Chol Calc (NIH): 114 mg/dL — ABNORMAL HIGH (ref 0–99)
Triglycerides: 101 mg/dL (ref 0–149)
VLDL Cholesterol Cal: 18 mg/dL (ref 5–40)

## 2020-05-06 LAB — SARS-COV-2 SEMI-QUANTITATIVE TOTAL ANTIBODY, SPIKE
SARS-CoV-2 Semi-Quant Total Ab: 66.5 U/mL (ref <0.8)
SARS-CoV-2 Spike Ab Interp: POSITIVE

## 2020-05-06 LAB — TSH: TSH: 1.07 u[IU]/mL (ref 0.450–4.500)

## 2020-05-06 LAB — FOLLICLE STIMULATING HORMONE: FSH: 62.3 m[IU]/mL

## 2020-05-18 ENCOUNTER — Other Ambulatory Visit: Payer: Self-pay

## 2020-05-18 ENCOUNTER — Ambulatory Visit: Payer: Medicare HMO

## 2020-09-24 ENCOUNTER — Other Ambulatory Visit: Payer: Self-pay | Admitting: Nurse Practitioner

## 2020-09-24 DIAGNOSIS — N644 Mastodynia: Secondary | ICD-10-CM

## 2020-10-11 ENCOUNTER — Encounter: Payer: Self-pay | Admitting: Nurse Practitioner

## 2020-10-18 ENCOUNTER — Other Ambulatory Visit: Payer: Medicare Other

## 2020-10-18 ENCOUNTER — Other Ambulatory Visit: Payer: Self-pay

## 2020-10-18 DIAGNOSIS — Z0000 Encounter for general adult medical examination without abnormal findings: Secondary | ICD-10-CM

## 2020-10-18 DIAGNOSIS — E559 Vitamin D deficiency, unspecified: Secondary | ICD-10-CM

## 2020-10-18 DIAGNOSIS — D509 Iron deficiency anemia, unspecified: Secondary | ICD-10-CM

## 2020-10-19 ENCOUNTER — Encounter: Payer: Self-pay | Admitting: Nurse Practitioner

## 2020-10-19 ENCOUNTER — Ambulatory Visit (INDEPENDENT_AMBULATORY_CARE_PROVIDER_SITE_OTHER): Payer: BC Managed Care – PPO | Admitting: Nurse Practitioner

## 2020-10-19 VITALS — BP 122/80 | HR 71 | Temp 98.1°F | Ht 64.2 in | Wt 231.6 lb

## 2020-10-19 DIAGNOSIS — Z2821 Immunization not carried out because of patient refusal: Secondary | ICD-10-CM

## 2020-10-19 DIAGNOSIS — G43009 Migraine without aura, not intractable, without status migrainosus: Secondary | ICD-10-CM | POA: Diagnosis not present

## 2020-10-19 DIAGNOSIS — H6123 Impacted cerumen, bilateral: Secondary | ICD-10-CM

## 2020-10-19 DIAGNOSIS — Z6839 Body mass index (BMI) 39.0-39.9, adult: Secondary | ICD-10-CM

## 2020-10-19 DIAGNOSIS — E6609 Other obesity due to excess calories: Secondary | ICD-10-CM | POA: Diagnosis not present

## 2020-10-19 DIAGNOSIS — E559 Vitamin D deficiency, unspecified: Secondary | ICD-10-CM | POA: Diagnosis not present

## 2020-10-19 DIAGNOSIS — Z0000 Encounter for general adult medical examination without abnormal findings: Secondary | ICD-10-CM | POA: Diagnosis not present

## 2020-10-19 LAB — LIPID PANEL
Chol/HDL Ratio: 2.5 ratio (ref 0.0–4.4)
Cholesterol, Total: 185 mg/dL (ref 100–199)
HDL: 73 mg/dL (ref >39)
LDL Chol Calc (NIH): 98 mg/dL (ref 0–99)
Triglycerides: 77 mg/dL (ref 0–149)
VLDL Cholesterol Cal: 14 mg/dL (ref 5–40)

## 2020-10-19 LAB — CBC
Hematocrit: 36.7 % (ref 34.0–46.6)
Hemoglobin: 11.3 g/dL (ref 11.1–15.9)
MCH: 25.1 pg — ABNORMAL LOW (ref 26.6–33.0)
MCHC: 30.8 g/dL — ABNORMAL LOW (ref 31.5–35.7)
MCV: 82 fL (ref 79–97)
Platelets: 273 10*3/uL (ref 150–450)
RBC: 4.5 x10E6/uL (ref 3.77–5.28)
RDW: 14.6 % (ref 11.7–15.4)
WBC: 5.2 10*3/uL (ref 3.4–10.8)

## 2020-10-19 LAB — CMP14+EGFR
ALT: 16 IU/L (ref 0–32)
AST: 20 IU/L (ref 0–40)
Albumin/Globulin Ratio: 1.3 (ref 1.2–2.2)
Albumin: 4 g/dL (ref 3.8–4.8)
Alkaline Phosphatase: 64 IU/L (ref 44–121)
BUN/Creatinine Ratio: 10 (ref 9–23)
BUN: 9 mg/dL (ref 6–24)
Bilirubin Total: 0.2 mg/dL (ref 0.0–1.2)
CO2: 22 mmol/L (ref 20–29)
Calcium: 9.1 mg/dL (ref 8.7–10.2)
Chloride: 104 mmol/L (ref 96–106)
Creatinine, Ser: 0.88 mg/dL (ref 0.57–1.00)
Globulin, Total: 3 g/dL (ref 1.5–4.5)
Glucose: 97 mg/dL (ref 65–99)
Potassium: 4.6 mmol/L (ref 3.5–5.2)
Sodium: 140 mmol/L (ref 134–144)
Total Protein: 7 g/dL (ref 6.0–8.5)
eGFR: 83 mL/min/{1.73_m2} (ref >59)

## 2020-10-19 LAB — VITAMIN D 25 HYDROXY (VIT D DEFICIENCY, FRACTURES): Vit D, 25-Hydroxy: 39 ng/mL (ref 30.0–100.0)

## 2020-10-19 MED ORDER — VITAMIN D (ERGOCALCIFEROL) 1.25 MG (50000 UNIT) PO CAPS: ORAL_CAPSULE | ORAL | 1 refills | Status: DC

## 2020-10-19 MED ORDER — MAGNESIUM 200 MG PO TABS: 1.0000 | ORAL_TABLET | Freq: Every day | ORAL | 5 refills | Status: DC

## 2020-10-19 NOTE — Patient Instructions (Signed)
Health Maintenance, Female Adopting a healthy lifestyle and getting preventive care are important in promoting health and wellness. Ask your health care provider about: The right schedule for you to have regular tests and exams. Things you can do on your own to prevent diseases and keep yourself healthy. What should I know about diet, weight, and exercise? Eat a healthy diet  Eat a diet that includes plenty of vegetables, fruits, low-fat dairy products, and lean protein. Do not eat a lot of foods that are high in solid fats, added sugars, or sodium.  Maintain a healthy weight Body mass index (BMI) is used to identify weight problems. It estimates body fat based on height and weight. Your health care provider can help determineyour BMI and help you achieve or maintain a healthy weight. Get regular exercise Get regular exercise. This is one of the most important things you can do for your health. Most adults should: Exercise for at least 150 minutes each week. The exercise should increase your heart rate and make you sweat (moderate-intensity exercise). Do strengthening exercises at least twice a week. This is in addition to the moderate-intensity exercise. Spend less time sitting. Even light physical activity can be beneficial. Watch cholesterol and blood lipids Have your blood tested for lipids and cholesterol at 44 years of age, then havethis test every 5 years. Have your cholesterol levels checked more often if: Your lipid or cholesterol levels are high. You are older than 44 years of age. You are at high risk for heart disease. What should I know about cancer screening? Depending on your health history and family history, you may need to have cancer screening at various ages. This may include screening for: Breast cancer. Cervical cancer. Colorectal cancer. Skin cancer. Lung cancer. What should I know about heart disease, diabetes, and high blood pressure? Blood pressure and heart  disease High blood pressure causes heart disease and increases the risk of stroke. This is more likely to develop in people who have high blood pressure readings, are of African descent, or are overweight. Have your blood pressure checked: Every 3-5 years if you are 18-39 years of age. Every year if you are 40 years old or older. Diabetes Have regular diabetes screenings. This checks your fasting blood sugar level. Have the screening done: Once every three years after age 40 if you are at a normal weight and have a low risk for diabetes. More often and at a younger age if you are overweight or have a high risk for diabetes. What should I know about preventing infection? Hepatitis B If you have a higher risk for hepatitis B, you should be screened for this virus. Talk with your health care provider to find out if you are at risk forhepatitis B infection. Hepatitis C Testing is recommended for: Everyone born from 1945 through 1965. Anyone with known risk factors for hepatitis C. Sexually transmitted infections (STIs) Get screened for STIs, including gonorrhea and chlamydia, if: You are sexually active and are younger than 44 years of age. You are older than 44 years of age and your health care provider tells you that you are at risk for this type of infection. Your sexual activity has changed since you were last screened, and you are at increased risk for chlamydia or gonorrhea. Ask your health care provider if you are at risk. Ask your health care provider about whether you are at high risk for HIV. Your health care provider may recommend a prescription medicine to help   prevent HIV infection. If you choose to take medicine to prevent HIV, you should first get tested for HIV. You should then be tested every 3 months for as long as you are taking the medicine. Pregnancy If you are about to stop having your period (premenopausal) and you may become pregnant, seek counseling before you get  pregnant. Take 400 to 800 micrograms (mcg) of folic acid every day if you become pregnant. Ask for birth control (contraception) if you want to prevent pregnancy. Osteoporosis and menopause Osteoporosis is a disease in which the bones lose minerals and strength with aging. This can result in bone fractures. If you are 65 years old or older, or if you are at risk for osteoporosis and fractures, ask your health care provider if you should: Be screened for bone loss. Take a calcium or vitamin D supplement to lower your risk of fractures. Be given hormone replacement therapy (HRT) to treat symptoms of menopause. Follow these instructions at home: Lifestyle Do not use any products that contain nicotine or tobacco, such as cigarettes, e-cigarettes, and chewing tobacco. If you need help quitting, ask your health care provider. Do not use street drugs. Do not share needles. Ask your health care provider for help if you need support or information about quitting drugs. Alcohol use Do not drink alcohol if: Your health care provider tells you not to drink. You are pregnant, may be pregnant, or are planning to become pregnant. If you drink alcohol: Limit how much you use to 0-1 drink a day. Limit intake if you are breastfeeding. Be aware of how much alcohol is in your drink. In the [place] one drink equals one 12 oz bottle of beer (355 mL), one 5 oz glass of wine (148 mL), or one 1 oz glass of hard liquor (44 mL). General instructions Schedule regular health, dental, and eye exams. Stay current with your vaccines. Tell your health care provider if: You often feel depressed. You have ever been abused or do not feel safe at home. Summary Adopting a healthy lifestyle and getting preventive care are important in promoting health and wellness. Follow your health care provider's instructions about healthy diet, exercising, and getting tested or screened for diseases. Follow your health care provider's  instructions on monitoring your cholesterol and blood pressure. This information is not intended to replace advice given to you by your health care provider. Make sure you discuss any questions you have with your healthcare provider. Document Revised: 04/17/2018 Document Reviewed: 04/17/2018 Elsevier Patient Education  2022 [employer]  

## 2020-10-19 NOTE — Progress Notes (Signed)
I,Yamilka Roman [employer] as a Neurosurgeon for [employer], FNP.,have documented all relevant documentation on the behalf of Arnette Felts, FNP,as directed by  Arnette Felts, FNP while in the presence of Arnette Felts, FNP.   This visit occurred during the SARS-CoV-2 public health emergency.  Safety protocols were in place, including screening questions prior to the visit, additional usage of staff PPE, and extensive cleaning of exam room while observing appropriate contact time as indicated for disinfecting solutions.  Subjective:     Patient ID: Heather Harding , female    DOB: April 28, 1976 , 45 y.o.   MRN: [id number]   Chief Complaint  Patient presents with   Annual Exam    HPI  Patient here for hm. Her Gyn is [facility] physician Dr. Gerald Leitz last seen in May 2022  Wt Readings from Last 3 Encounters: 10/19/20 : 231 lb 9.6 oz (105.1 kg) 05/05/20 : 234 lb 12.8 oz (106.5 kg) 02/11/20 : 241 lb (109.3 kg)   Reports a history of migraines - has had for several years. She has been taking tylenol 3 extra strength. She is drinking one bottle of water on average. She will drink gatorade or tea. She does not eat increased salt. Denies eating high amounts of MSG. She will have light sensitivity, nausea and bowel disturbance.   She is to have an ultrasound of her left breast due to having a lump, does not want to have mammogram      Past Medical History:  Diagnosis Date   Asthma    GERD (gastroesophageal reflux disease)    Lung abnormality    "right lung underdeveloped"     Family History  Problem Relation Age of Onset   Cancer Paternal Grandfather    Stroke Maternal Grandmother    Stroke Maternal Grandfather    Diabetes Father    Hypertension Sister      Current Outpatient Medications:    albuterol (PROVENTIL HFA;VENTOLIN HFA) 108 (90 Base) MCG/ACT inhaler, Inhale 1-2 puffs into the lungs every 6 (six) hours as needed for wheezing or shortness of breath., Disp: 1 Inhaler, Rfl: 0    budesonide-formoterol (SYMBICORT) 160-4.5 MCG/ACT inhaler, Inhale 2 puffs into the lungs 2 (two) times daily., Disp: 1 Inhaler, Rfl: 1   Cetirizine HCl (ZYRTEC ALLERGY) 10 MG CAPS, Take 1 capsule (10 mg total) by mouth daily., Disp: 30 capsule, Rfl: 0   Cyanocobalamin (VITAMIN B12 PO), Take 1 tablet by mouth daily at 12 noon., Disp: , Rfl:    Ferrous Sulfate (IRON PO), Take by mouth. Take one tablet daily, Disp: , Rfl:    Magnesium 200 MG TABS, Take 1 tablet (200 mg total) by mouth daily. Take with evening meal, Disp: 30 tablet, Rfl: 5   omeprazole (PRILOSEC) 10 MG capsule, Take 10 mg by mouth as needed., Disp: , Rfl:    Vitamin D, Ergocalciferol, (DRISDOL) 1.25 MG (50000 UNIT) CAPS capsule, TAKE 1 CAPSULE BY MOUTH ONCE A WEEK (EVERY  7  (SEVEN)  DAYS), Disp: 12 capsule, Rfl: 1   Allergies  Allergen Reactions   Aspirin Other (See Comments)    Other Reaction: RYE   Morphine Other (See Comments)    Family hx of allergy   Morphine And Related    Codeine Rash      The patient states she uses none for birth control. Last LMP was Patient's last menstrual period was 09/28/2020.. Negative for Dysmenorrhea and Negative for Menorrhagia. Negative for: breast discharge, breast lump(s), breast pain and breast self  exam. Associated symptoms include abnormal vaginal bleeding. Pertinent negatives include abnormal bleeding (hematology), anxiety, decreased libido, depression, difficulty falling sleep, dyspareunia, history of infertility, nocturia, sexual dysfunction, sleep disturbances, urinary incontinence, urinary urgency, vaginal discharge and vaginal itching. Diet regular; tries to eat healthy with mostly vegetables.The patient states her exercise level is minimal except for at work at [employer]  The patient's tobacco use is:  Social History   Tobacco Use  Smoking Status Never  Smokeless Tobacco Never   She has been exposed to passive smoke. The patient's alcohol use is:  Social History   Substance and  Sexual Activity  Alcohol Use No   Additional information: Last pap 09/23/2020, next one scheduled for 09/23/2021.    Review of Systems  Constitutional: Negative.   HENT: Negative.    Eyes: Negative.   Respiratory: Negative.  Negative for cough and wheezing.   Cardiovascular: Negative.  Negative for chest pain, palpitations and leg swelling.  Gastrointestinal: Negative.   Endocrine: Negative.   Genitourinary: Negative.   Musculoskeletal: Negative.   Skin: Negative.   Allergic/Immunologic: Negative.   Neurological:  Positive for headaches. Negative for dizziness.  Hematological: Negative.   Psychiatric/Behavioral: Negative.      Today's Vitals   10/19/20 1514  BP: 122/80  Pulse: 71  Temp: 98.1 F (36.7 C)  TempSrc: Oral  Weight: 231 lb 9.6 oz (105.1 kg)  Height: 5' 4.2" (1.631 m)  PainSc: 0-No pain   Body mass index is 39.51 kg/m.   Objective:  Physical Exam Vitals reviewed.  Constitutional:      General: She is not in acute distress.    Appearance: Normal appearance. She is well-developed.  HENT:     Head: Normocephalic and atraumatic.     Right Ear: Hearing, tympanic membrane, ear canal and external ear normal. There is impacted cerumen.     Left Ear: Hearing, tympanic membrane, ear canal and external ear normal. There is impacted cerumen.     Nose:     Comments: Deferred - masked    Mouth/Throat:     Comments: Deferred - masked Eyes:     General: Lids are normal.     Extraocular Movements: Extraocular movements intact.     Conjunctiva/sclera: Conjunctivae normal.     Pupils: Pupils are equal, round, and reactive to light.     Funduscopic exam:    Right eye: No papilledema.        Left eye: No papilledema.  Neck:     Thyroid: No thyroid mass.     Vascular: No carotid bruit.  Cardiovascular:     Rate and Rhythm: Normal rate and regular rhythm.     Pulses: Normal pulses.     Heart sounds: Normal heart sounds. No murmur heard. Pulmonary:     Effort:  Pulmonary effort is normal.     Breath sounds: Normal breath sounds.  Abdominal:     General: Abdomen is flat. Bowel sounds are normal.     Palpations: Abdomen is soft.  Genitourinary:    General: Normal vulva.     Exam position: Lithotomy position.     Vagina: Normal.     Cervix: Normal.  Musculoskeletal:        General: No swelling. Normal range of motion.     Cervical back: Full passive range of motion without pain, normal range of motion and neck supple.     Right lower leg: No edema.     Left lower leg: No edema.  Skin:  General: Skin is warm and dry.     Capillary Refill: Capillary refill takes less than 2 seconds.  Neurological:     General: No focal deficit present.     Mental Status: She is alert and oriented to person, place, and time.     Cranial Nerves: No cranial nerve deficit.     Sensory: No sensory deficit.  Psychiatric:        Mood and Affect: Mood normal.        Behavior: Behavior normal.        Thought Content: Thought content normal.        Judgment: Judgment normal.        Assessment And Plan:     1. Encounter for general adult medical examination w/o abnormal findings Behavior modifications discussed and diet history reviewed.   Pt will continue to exercise regularly and modify diet with low GI, plant based foods and decrease intake of processed foods.  Recommend intake of daily multivitamin, Vitamin D, and calcium.  Recommend mammogram for preventive screenings, as well as recommend immunizations that include influenza, TDAP  2. Class 2 obesity due to excess calories with body mass index (BMI) of 39.0 to 39.9 in adult, unspecified whether serious comorbidity present Chronic Discussed healthy diet and regular exercise options  Encouraged to exercise at least 150 minutes per week with 2 days of strength training  3. COVID-19 vaccination declined Declines covid 19 vaccine. Discussed risk of covid 1 and if she changes her mind about the vaccine to  call the office.  Encouraged to take multivitamin, vitamin d, vitamin c and zinc to increase immune system. Aware can call office if would like to have vaccine here at office.   4. Tetanus toxoid declined  5. Vitamin D deficiency Will check vitamin D level and supplement as needed.    Also encouraged to spend 15 minutes in the sun daily.  - Vitamin D, Ergocalciferol, (DRISDOL) 1.25 MG (50000 UNIT) CAPS capsule; TAKE 1 CAPSULE BY MOUTH ONCE A WEEK (EVERY  7  (SEVEN)  DAYS)  Dispense: 12 capsule; Refill: 1  6. Migraine without aura and without status migrainosus, not intractable She is encouraged to take magnesium daily as she likes to do natural remedies Also encouraged to stay well hydrated with water - Magnesium 200 MG TABS; Take 1 tablet (200 mg total) by mouth daily. Take with evening meal  Dispense: 30 tablet; Refill: 5  7. Bilateral impacted cerumen She is to use 1/2 water and 1/2 peroxide solution to clean her ears   Patient was given opportunity to ask questions. Patient verbalized understanding of the plan and was able to repeat key elements of the plan. All questions were answered to their satisfaction.   Arnette Felts, FNP   I, Arnette Felts, FNP, have reviewed all documentation for this visit. The documentation on 11/11/20 for the exam, diagnosis, procedures, and orders are all accurate and complete.  THE PATIENT IS ENCOURAGED TO PRACTICE SOCIAL DISTANCING DUE TO THE COVID-19 PANDEMIC.

## 2020-11-03 ENCOUNTER — Ambulatory Visit: Payer: BC Managed Care – PPO | Admitting: Nurse Practitioner

## 2020-11-03 ENCOUNTER — Ambulatory Visit: Admission: RE | Admit: 2020-11-03 | Payer: Self-pay | Source: Ambulatory Visit

## 2020-11-03 ENCOUNTER — Other Ambulatory Visit: Payer: Self-pay | Admitting: Nurse Practitioner

## 2020-11-03 ENCOUNTER — Other Ambulatory Visit: Payer: Self-pay

## 2020-11-03 ENCOUNTER — Ambulatory Visit
Admission: RE | Admit: 2020-11-03 | Discharge: 2020-11-03 | Disposition: A | Discharge status: Home / Self Care | Payer: BC Managed Care – PPO | Source: Ambulatory Visit | Attending: Nurse Practitioner | Admitting: Nurse Practitioner

## 2020-11-03 DIAGNOSIS — N644 Mastodynia: Secondary | ICD-10-CM

## 2020-11-03 DIAGNOSIS — Z1231 Encounter for screening mammogram for malignant neoplasm of breast: Secondary | ICD-10-CM

## 2020-11-09 ENCOUNTER — Other Ambulatory Visit: Payer: Self-pay | Admitting: Nurse Practitioner

## 2020-11-09 DIAGNOSIS — R928 Other abnormal and inconclusive findings on diagnostic imaging of breast: Secondary | ICD-10-CM

## 2020-11-23 ENCOUNTER — Ambulatory Visit
Admission: RE | Admit: 2020-11-23 | Discharge: 2020-11-23 | Disposition: A | Discharge status: Home / Self Care | Payer: BC Managed Care – PPO | Source: Ambulatory Visit | Attending: Nurse Practitioner | Admitting: Nurse Practitioner

## 2020-11-23 ENCOUNTER — Other Ambulatory Visit: Payer: Self-pay

## 2020-11-23 DIAGNOSIS — R928 Other abnormal and inconclusive findings on diagnostic imaging of breast: Secondary | ICD-10-CM

## 2021-02-18 ENCOUNTER — Other Ambulatory Visit: Payer: Self-pay

## 2021-02-18 ENCOUNTER — Ambulatory Visit
Admission: EM | Admit: 2021-02-18 | Discharge: 2021-02-18 | Disposition: A | Discharge status: Home / Self Care | Payer: BC Managed Care – PPO | Attending: Emergency Medicine | Admitting: Emergency Medicine

## 2021-02-18 DIAGNOSIS — B9689 Other specified bacterial agents as the cause of diseases classified elsewhere: Secondary | ICD-10-CM | POA: Insufficient documentation

## 2021-02-18 DIAGNOSIS — B3731 Acute candidiasis of vulva and vagina: Secondary | ICD-10-CM | POA: Insufficient documentation

## 2021-02-18 DIAGNOSIS — N760 Acute vaginitis: Secondary | ICD-10-CM | POA: Diagnosis present

## 2021-02-18 HISTORY — DX: Other specified bacterial agents as the cause of diseases classified elsewhere: N76.0

## 2021-02-18 HISTORY — DX: Other specified bacterial agents as the cause of diseases classified elsewhere: B96.89

## 2021-02-18 HISTORY — DX: Candidiasis, unspecified: B37.9

## 2021-02-18 LAB — POCT URINE PREGNANCY: Preg Test, Ur: NEGATIVE

## 2021-02-18 LAB — POCT URINALYSIS DIP (MANUAL ENTRY)
Bilirubin, UA: NEGATIVE
Blood, UA: NEGATIVE
Glucose, UA: NEGATIVE mg/dL
Leukocytes, UA: NEGATIVE
Nitrite, UA: NEGATIVE
Spec Grav, UA: 1.025 (ref 1.010–1.025)
Urobilinogen, UA: 0.2 E.U./dL
pH, UA: 7 (ref 5.0–8.0)

## 2021-02-18 MED ORDER — FLUCONAZOLE 150 MG PO TABS: 150.0000 mg | ORAL_TABLET | Freq: Every day | ORAL | 0 refills | Status: AC

## 2021-02-18 MED ORDER — METRONIDAZOLE 500 MG PO TABS: 500.0000 mg | ORAL_TABLET | Freq: Two times a day (BID) | ORAL | 0 refills | Status: DC

## 2021-02-18 NOTE — ED Provider Notes (Signed)
SUBJECTIVE:  Heather Harding is a very pleasant 45 y.o. Female presents with concern due to vaginal irritation, discharge and foul odor.  Patient reports a history of BV and yeast infections.  Patient has used a capsule of boric acid.  No fever, abdominal pain or dysuria.  ROS: General/Constitutional: No fever, chills, or sweats GI: No abdominal pain, nausea/vomiting or diarrhea GU: No urinary frequency or dysuria Genitalia: As above Lymph: No swelling, red streaks or swollen lymph nodes Skin: No rashes or skin lesions   OBJECTIVE: Vitals:   02/18/21 0918  BP: 136/89  Pulse: 75  Resp: 16  Temp: 97.9 F (36.6 C)  SpO2: 98%     General: Appears well-developed and well-nourished. No acute distress.  Cardiovascular: Normal rate Pulm/Chest: No respiratory distress Neurological: Alert and oriented to person, place, and time.  Skin: Skin is warm and dry.  Psychiatric: Normal mood, affect, behavior, and thought content.  GU:  Deferred secondary to self-collect specimen   Laboratory:  Orders Placed This Encounter  Procedures   POCT urinalysis dipstick   POCT urine pregnancy   Results for orders placed or performed during the hospital encounter of 02/18/21  POCT urinalysis dipstick  Result Value Ref Range   Color, UA yellow yellow   Clarity, UA clear clear   Glucose, UA negative negative mg/dL   Bilirubin, UA negative negative   Ketones, POC UA small (15) (A) negative mg/dL   Spec Grav, UA 6.237 6.283 - 1.025   Blood, UA negative negative   pH, UA 7.0 5.0 - 8.0   Protein Ur, POC trace (A) negative mg/dL   Urobilinogen, UA 0.2 0.2 or 1.0 E.U./dL   Nitrite, UA Negative Negative   Leukocytes, UA Negative Negative  POCT urine pregnancy  Result Value Ref Range   Preg Test, Ur Negative Negative     Assessment: 1. Bacterial vaginosis - Cervicovaginal ancillary only; Standing - POCT urinalysis dipstick; Standing - POCT urine pregnancy; Standing - Cervicovaginal ancillary  only - POCT urinalysis dipstick - POCT urine pregnancy - metroNIDAZOLE (FLAGYL) 500 MG tablet; Take 1 tablet (500 mg total) by mouth 2 (two) times daily.  Dispense: 14 tablet; Refill: 0  2. Vaginal yeast infection - fluconazole (DIFLUCAN) 150 MG tablet; Take 1 tablet (150 mg total) by mouth daily for 1 day.  Dispense: 2 tablet; Refill: 0   Plan:  If new medications were prescribed and/or administered during this visit Instructions about new medications and side effects provided.  If any changes in chronic medications then new reconciled medication list is given to patient.    MDM: Patient presents with concern due to vaginal irritation, discharge and foul odor.  Patient reports a history of BV and yeast infections.  Patient has used a capsule of boric acid.  No fever, abdominal pain or dysuria. Urinalysis: small ketones and trace protein. HCG: negative.  APTIMA swab pending.  Given symptoms, likely BV and yeast.  Rx'd Flagyl and Diflucan to the patient's preferred pharmacy and advised about home treatment and care as outlined in her AVS to include wearing loose clothing, and cotton underwear.  Advised to take her first Diflucan tablet today and her second Diflucan tablet after she completes her Flagyl antibiotic treatment.  Advised to return to clinic with any unexpected vaginal bleeding, new or increased pain in her pelvis.  Follow-up with gynecology if the symptoms continue to be recurrent.  Patient verbalized understanding and agreed with plan.  Patient stable upon discharge.  Return as needed.  Instructions:    Discharge Instructions      Your testing has been sent to the lab.  If your testing results as positive, you will be notified via phone and we will initiate treatment at that time.  If you do not receive a phone call from [place] within the next 2 to 3 days, check your MyChart for updated health information. Do not engage in sex until you know your results. If you test positive you will  need to abstain from sex for 7 days and notify all partners.   Take your first Diflucan today and your second Diflucan after you complete your Flagyl antibiotic treatment.  A vaginal yeast infection is caused by too many yeast cells in the vagina. This is common in women of all ages. Itching, vaginal discharge and irritation, and other symptoms can bother you. But yeast infections don't often cause other health problems.  Some medicines can increase your risk of getting a yeast infection. These include antibiotics, birth control pills, hormones, and steroids. You may also be more likely to get a yeast infection if you are pregnant, have diabetes, douche, or wear tight clothes.  With treatment, most yeast infections get better in 2 to 3 days.  Do not use tampons while using a vaginal cream or suppository. The tampons can absorb the medicine. Use pads instead. Wear loose cotton clothing. Do not wear nylon or other fabric that holds body heat and moisture close to the skin. Try sleeping without underwear. Do not scratch. Relieve itching with a cold pack or a cool bath. Do not wash your vaginal area more than once a day. Use plain water or a mild, unscented soap. Air-dry the vaginal area. Change out of wet swimsuits after swimming. Do not have sex until you have finished your treatment. Do not douche  Return to clinic if you have unexpected vaginal bleeding or if there is new or increased pain in your pelvis. Follow-up with your gynecologist if you have symptoms that return after treatment or if you have recurrent infections.    Bacterial vaginosis (BV) is caused by an change in the vaginal bacteria, therefore causing vaginal an off-white, thin, and "fishy smelling" discharge that is more noticeable after sexual intercourse and during menses. Take Flagyl (metronidazole) as directed (do not drink any alcohol or engage in sexual intercourse while taking this). Avoid scented soaps. Wear cotton  underwear and void after sexual intercourse. Do not have sexual intercourse for one week.  If symptoms become recurrent, please follow up with a gynecologist. .         Amalia Greenhouse, FNP 02/18/21 [phone]

## 2021-02-18 NOTE — ED Triage Notes (Signed)
Patient presents to Urgent Care with complaints of vaginal irritation, discharge, and foul odor. Hx of BV and yeast infections. Pt used a capsule of boric acid.   Denies fever, abdominal pain.

## 2021-02-18 NOTE — Discharge Instructions (Addendum)
Your testing has been sent to the lab.  If your testing results as positive, you will be notified via phone and we will initiate treatment at that time.  If you do not receive a phone call from [place] within the next 2 to 3 days, check your MyChart for updated health information. Do not engage in sex until you know your results. If you test positive you will need to abstain from sex for 7 days and notify all partners.   Take your first Diflucan today and your second Diflucan after you complete your Flagyl antibiotic treatment.  A vaginal yeast infection is caused by too many yeast cells in the vagina. This is common in women of all ages. Itching, vaginal discharge and irritation, and other symptoms can bother you. But yeast infections don't often cause other health problems.  Some medicines can increase your risk of getting a yeast infection. These include antibiotics, birth control pills, hormones, and steroids. You may also be more likely to get a yeast infection if you are pregnant, have diabetes, douche, or wear tight clothes.  With treatment, most yeast infections get better in 2 to 3 days.  Do not use tampons while using a vaginal cream or suppository. The tampons can absorb the medicine. Use pads instead. Wear loose cotton clothing. Do not wear nylon or other fabric that holds body heat and moisture close to the skin. Try sleeping without underwear. Do not scratch. Relieve itching with a cold pack or a cool bath. Do not wash your vaginal area more than once a day. Use plain water or a mild, unscented soap. Air-dry the vaginal area. Change out of wet swimsuits after swimming. Do not have sex until you have finished your treatment. Do not douche  Return to clinic if you have unexpected vaginal bleeding or if there is new or increased pain in your pelvis. Follow-up with your gynecologist if you have symptoms that return after treatment or if you have recurrent infections.    Bacterial vaginosis  (BV) is caused by an change in the vaginal bacteria, therefore causing vaginal an off-white, thin, and "fishy smelling" discharge that is more noticeable after sexual intercourse and during menses. Take Flagyl (metronidazole) as directed (do not drink any alcohol or engage in sexual intercourse while taking this). Avoid scented soaps. Wear cotton underwear and void after sexual intercourse. Do not have sexual intercourse for one week.  If symptoms become recurrent, please follow up with a gynecologist. .

## 2021-02-21 LAB — CERVICOVAGINAL ANCILLARY ONLY
Bacterial Vaginitis (gardnerella): POSITIVE — AB
Candida Glabrata: NEGATIVE
Candida Vaginitis: NEGATIVE
Chlamydia: NEGATIVE
Comment: NEGATIVE
Comment: NEGATIVE
Comment: NEGATIVE
Comment: NEGATIVE
Comment: NEGATIVE
Comment: NORMAL
Neisseria Gonorrhea: NEGATIVE
Trichomonas: NEGATIVE

## 2021-03-30 ENCOUNTER — Other Ambulatory Visit: Payer: Self-pay

## 2021-03-30 ENCOUNTER — Emergency Department (HOSPITAL_BASED_OUTPATIENT_CLINIC_OR_DEPARTMENT_OTHER)
Admission: EM | Admit: 2021-03-30 | Discharge: 2021-03-30 | Disposition: A | Discharge status: Home / Self Care | Payer: BC Managed Care – PPO | Attending: Emergency Medicine | Admitting: Emergency Medicine

## 2021-03-30 ENCOUNTER — Encounter (HOSPITAL_BASED_OUTPATIENT_CLINIC_OR_DEPARTMENT_OTHER): Payer: Self-pay

## 2021-03-30 DIAGNOSIS — Z20822 Contact with and (suspected) exposure to covid-19: Secondary | ICD-10-CM | POA: Insufficient documentation

## 2021-03-30 DIAGNOSIS — J45909 Unspecified asthma, uncomplicated: Secondary | ICD-10-CM | POA: Insufficient documentation

## 2021-03-30 DIAGNOSIS — J069 Acute upper respiratory infection, unspecified: Secondary | ICD-10-CM | POA: Diagnosis not present

## 2021-03-30 DIAGNOSIS — R0981 Nasal congestion: Secondary | ICD-10-CM | POA: Diagnosis present

## 2021-03-30 LAB — RESP PANEL BY RT-PCR (FLU A&B, COVID) ARPGX2
Influenza A by PCR: NEGATIVE
Influenza B by PCR: NEGATIVE
SARS Coronavirus 2 by RT PCR: NEGATIVE

## 2021-03-30 NOTE — ED Provider Notes (Addendum)
[facility] EMERGENCY DEPT Provider Note   CSN: [id number] Arrival date & time: 03/30/21  1820     History Chief Complaint  Patient presents with   Nasal Congestion   Sore Throat    Heather Harding is a 45 y.o. female.  Patient with history of asthma presents to the emergency department today for evaluation of URI symptoms.  Symptoms started over the past week.  She works with students.  She has had runny nose and congestion as well as sore throat and cough.  No vomiting or diarrhea.  No fevers or chills.  She presented today to make sure that she did not have COVID.  She states that she has been using her albuterol inhaler slightly more than typical but is not having any respiratory distress.  Onset of symptoms acute.  Course is constant.      Past Medical History:  Diagnosis Date   Asthma    BV (bacterial vaginosis)    GERD (gastroesophageal reflux disease)    Lung abnormality    "right lung underdeveloped"   Yeast infection     Patient Active Problem List   Diagnosis Date Noted   ANEMIA, IRON DEFICIENCY, UNSPEC. 07/05/2006   ASTHMA, UNSPECIFIED 07/05/2006   GASTROESOPHAGEAL REFLUX, NO ESOPHAGITIS 07/05/2006    No past surgical history on file.   OB History     Gravida  1   Para  0   Term      Preterm      AB  1   Living  0      SAB      IAB  1   Ectopic      Multiple      Live Births  0           Family History  Problem Relation Age of Onset   Cancer Paternal Grandfather    Stroke Maternal Grandmother    Stroke Maternal Grandfather    Diabetes Father    Hypertension Sister     Social History   Tobacco Use   Smoking status: Never   Smokeless tobacco: Never  Vaping Use   Vaping Use: Never used  Substance Use Topics   Alcohol use: No   Drug use: No    Home Medications Prior to Admission medications   Medication Sig Start Date End Date Taking? Authorizing Provider  albuterol (PROVENTIL HFA;VENTOLIN HFA) 108  (90 Base) MCG/ACT inhaler Inhale 1-2 puffs into the lungs every 6 (six) hours as needed for wheezing or shortness of breath. 07/09/17   Georgetta Haber, NP  BREZTRI AEROSPHERE 160-9-4.8 MCG/ACT AERO Inhale into the lungs. 02/04/21   [provider]  budesonide-formoterol (SYMBICORT) 160-4.5 MCG/ACT inhaler Inhale 2 puffs into the lungs 2 (two) times daily. 02/26/17   Mardella Layman, MD  Cetirizine HCl (ZYRTEC ALLERGY) 10 MG CAPS Take 1 capsule (10 mg total) by mouth daily. 08/22/17   Cathie Hoops, Amy V, PA-C  Cyanocobalamin (VITAMIN B12 PO) Take 1 tablet by mouth daily at 12 noon.    [provider]  Ferrous Sulfate (IRON PO) Take by mouth. Take one tablet daily    [provider]  Magnesium 200 MG TABS Take 1 tablet (200 mg total) by mouth daily. Take with evening meal 10/19/20   Arnette Felts, FNP  metroNIDAZOLE (FLAGYL) 500 MG tablet Take 1 tablet (500 mg total) by mouth 2 (two) times daily. 02/18/21   Boddu, Belenda Cruise, FNP  omeprazole (PRILOSEC) 10 MG capsule Take 10 mg by  mouth as needed.    [provider]  Vitamin D, Ergocalciferol, (DRISDOL) 1.25 MG (50000 UNIT) CAPS capsule TAKE 1 CAPSULE BY MOUTH ONCE A WEEK (EVERY  7  (SEVEN)  DAYS) 10/19/20   Arnette Felts, FNP    Allergies    Aspirin, Morphine, Morphine and related, and Codeine  Review of Systems   Review of Systems  Constitutional:  Negative for chills, fatigue and fever.  HENT:  Positive for congestion, rhinorrhea and sore throat. Negative for ear pain and sinus pressure.   Eyes:  Negative for redness.  Respiratory:  Positive for cough. Negative for wheezing.   Gastrointestinal:  Negative for abdominal pain, diarrhea, nausea and vomiting.  Genitourinary:  Negative for dysuria.  Musculoskeletal:  Negative for myalgias and neck stiffness.  Skin:  Negative for rash.  Neurological:  Positive for headaches.  Hematological:  Negative for adenopathy.   Physical Exam Updated Vital Signs BP (!) 158/99 (BP  Location: Right Arm)   Pulse 91   Temp (!) 97 F (36.1 C) (Temporal)   Resp 16   SpO2 99%   Physical Exam Vitals and nursing note reviewed.  Constitutional:      Appearance: She is well-developed.  HENT:     Head: Normocephalic and atraumatic.     Jaw: No trismus.     Right Ear: Tympanic membrane, ear canal and external ear normal.     Left Ear: Tympanic membrane, ear canal and external ear normal.     Nose: Nose normal. No mucosal edema, congestion or rhinorrhea.     Mouth/Throat:     Mouth: Mucous membranes are moist. Mucous membranes are not dry. No oral lesions.     Pharynx: Uvula midline. No oropharyngeal exudate, posterior oropharyngeal erythema or uvula swelling.     Tonsils: No tonsillar abscesses.  Eyes:     General:        Right eye: No discharge.        Left eye: No discharge.     Conjunctiva/sclera: Conjunctivae normal.  Cardiovascular:     Rate and Rhythm: Normal rate and regular rhythm.     Heart sounds: Normal heart sounds.  Pulmonary:     Effort: Pulmonary effort is normal. No respiratory distress.     Breath sounds: Normal breath sounds. No wheezing or rales.     Comments: Lungs clear without wheezing. Abdominal:     Palpations: Abdomen is soft.     Tenderness: There is no abdominal tenderness.  Musculoskeletal:     Cervical back: Normal range of motion and neck supple.  Lymphadenopathy:     Cervical: No cervical adenopathy.  Skin:    General: Skin is warm and dry.  Neurological:     Mental Status: She is alert.  Psychiatric:        Mood and Affect: Mood normal.    ED Results / Procedures / Treatments   Labs (all labs ordered are listed, but only abnormal results are displayed) Labs Reviewed  RESP PANEL BY RT-PCR (FLU A&B, COVID) [id number]    EKG None  Radiology No results found.  Procedures Procedures   Medications Ordered in ED Medications - No data to display  ED Course  I have reviewed the triage vital signs and the nursing  notes.  Pertinent labs & imaging results that were available during my care of the patient were reviewed by me and considered in my medical decision making (see chart for details).  Patient seen and examined.  COVID testing sent.  Patient will check MyChart.  Discussed symptomatic treatments.  Encouraged return with worsening shortness of breath or difficulty breathing.  Vital signs reviewed and are as follows: BP (!) 158/99 (BP Location: Right Arm)   Pulse 91   Temp (!) 97 F (36.1 C) (Temporal)   Resp 16   SpO2 99%   Urged to see PCP if symptoms persist for more than 3 days. Discussed s/s to return including worsening symptoms, persistent fever, persistent vomiting, or if they have any other concerns. Patient verbalizes understanding and agrees with plan.   Heather Harding was evaluated in Emergency Department on 03/30/2021 for the symptoms described in the history of present illness. She was evaluated in the context of the global COVID-19 pandemic, which necessitated consideration that the patient might be at risk for infection with the SARS-CoV-2 virus that causes COVID-19. Institutional protocols and algorithms that pertain to the evaluation of patients at risk for COVID-19 are in a state of rapid change based on information released by regulatory bodies including the CDC and federal and state organizations. These policies and algorithms were followed during the patient's care in the ED.      MDM Rules/Calculators/A&P                           Patient with symptoms consistent with a viral syndrome. Vitals are stable, no fever. No signs of dehydration. Lung exam normal, no signs of pneumonia. Supportive therapy indicated with return if symptoms worsen.    Final Clinical Impression(s) / ED Diagnoses Final diagnoses:  Upper respiratory tract infection, unspecified type    Rx / DC Orders ED Discharge Orders     None        Renne Crigler, PA-C 03/30/21 1842    Renne Crigler, PA-C 03/30/21 1842    Cheryll Cockayne, MD 03/30/21 [phone]

## 2021-03-30 NOTE — Discharge Instructions (Signed)
Please read and follow all provided instructions.  Your diagnoses today include:  1. Upper respiratory tract infection, unspecified type     You appear to have an upper respiratory infection (URI). An upper respiratory tract infection, or cold, is a viral infection of the air passages leading to the lungs. It should improve gradually after 5-7 days. You may have a lingering cough that lasts for 2- 4 weeks after the infection.  Tests performed today include: Vital signs. See below for your results today.  COVID/flu testing: Pending, will be available in MyChart in a few hours  Medications prescribed:  None  Take any prescribed medications only as directed. Treatment for your infection is aimed at treating the symptoms. There are no medications, such as antibiotics, that will cure your infection.   Home care instructions:  You can take Tylenol and/or Ibuprofen as directed on the packaging for fever reduction and pain relief.    For cough: honey 1/2 to 1 teaspoon (you can dilute the honey in water or another fluid).  You can also use guaifenesin and dextromethorphan for cough. You can use a humidifier for chest congestion and cough.  If you don't have a humidifier, you can sit in the bathroom with the hot shower running.      For sore throat: try warm salt water gargles, cepacol lozenges, throat spray, warm tea or water with lemon/honey, popsicles or ice, or OTC cold relief medicine for throat discomfort.    For congestion: take a daily anti-histamine like Zyrtec, Claritin, and a oral decongestant, such as pseudoephedrine.  You can also use Flonase 1-2 sprays in each nostril daily.    It is important to stay hydrated: drink plenty of fluids (water, gatorade/powerade/pedialyte, juices, or teas) to keep your throat moisturized and help further relieve irritation/discomfort.   Your illness is contagious and can be spread to others, especially during the first 3 or 4 days. It cannot be cured by  antibiotics or other medicines. Take basic precautions such as washing your hands often, covering your mouth when you cough or sneeze, and avoiding public places where you could spread your illness to others.   Please continue drinking plenty of fluids.  Use over-the-counter medicines as needed as directed on packaging for symptom relief.  You may also use ibuprofen or tylenol as directed on packaging for pain or fever.  Do not take multiple medicines containing Tylenol or acetaminophen to avoid taking too much of this medication.  Follow-up instructions: Please follow-up with your primary care provider in the next 3 days for further evaluation of your symptoms if you are not feeling better.   Return instructions:  Please return to the Emergency Department if you experience worsening symptoms.  RETURN IMMEDIATELY IF you develop shortness of breath, confusion or altered mental status, a new rash, become dizzy, faint, or poorly responsive, or are unable to be cared for at home. Please return if you have persistent vomiting and cannot keep down fluids or develop a fever that is not controlled by tylenol or motrin.   Please return if you have any other emergent concerns.  Additional Information:  Your vital signs today were: BP (!) 158/99 (BP Location: Right Arm)   Pulse 91   Temp (!) 97 F (36.1 C) (Temporal)   Resp 16   SpO2 99%  If your blood pressure (BP) was elevated above 135/85 this visit, please have this repeated by your doctor within one month. --------------

## 2021-03-30 NOTE — ED Triage Notes (Signed)
Pt presents with runny nose and sore throat x1 week. Intermittent headaches.

## 2021-05-04 ENCOUNTER — Encounter: Payer: Self-pay | Admitting: Emergency Medicine

## 2021-05-04 ENCOUNTER — Ambulatory Visit
Admission: EM | Admit: 2021-05-04 | Discharge: 2021-05-04 | Disposition: A | Discharge status: Home / Self Care | Payer: BC Managed Care – PPO | Attending: Medical Oncology | Admitting: Medical Oncology

## 2021-05-04 ENCOUNTER — Other Ambulatory Visit: Payer: Self-pay | Admitting: Nurse Practitioner

## 2021-05-04 ENCOUNTER — Other Ambulatory Visit: Payer: Self-pay

## 2021-05-04 DIAGNOSIS — E559 Vitamin D deficiency, unspecified: Secondary | ICD-10-CM

## 2021-05-04 DIAGNOSIS — J209 Acute bronchitis, unspecified: Secondary | ICD-10-CM

## 2021-05-04 DIAGNOSIS — N926 Irregular menstruation, unspecified: Secondary | ICD-10-CM

## 2021-05-04 DIAGNOSIS — Z8709 Personal history of other diseases of the respiratory system: Secondary | ICD-10-CM

## 2021-05-04 DIAGNOSIS — Z1152 Encounter for screening for COVID-19: Secondary | ICD-10-CM

## 2021-05-04 LAB — POCT URINE PREGNANCY: Preg Test, Ur: NEGATIVE

## 2021-05-04 MED ORDER — AZITHROMYCIN 250 MG PO TABS: ORAL_TABLET | ORAL | 0 refills | Status: DC

## 2021-05-04 MED ORDER — FLUTICASONE PROPIONATE 50 MCG/ACT NA SUSP: 2.0000 | Freq: Every day | NASAL | 0 refills | Status: DC

## 2021-05-04 MED ORDER — BENZONATATE 100 MG PO CAPS: 100.0000 mg | ORAL_CAPSULE | Freq: Three times a day (TID) | ORAL | 0 refills | Status: DC

## 2021-05-04 MED ORDER — PREDNISONE 10 MG (21) PO TBPK: ORAL_TABLET | Freq: Every day | ORAL | 0 refills | Status: DC

## 2021-05-04 MED ORDER — ALBUTEROL SULFATE HFA 108 (90 BASE) MCG/ACT IN AERS: 1.0000 | INHALATION_SPRAY | Freq: Four times a day (QID) | RESPIRATORY_TRACT | 0 refills | Status: AC | PRN

## 2021-05-04 NOTE — ED Provider Notes (Signed)
Renaldo Fiddler    CSN: [id number] Arrival date & time: 05/04/21  1015      History   Chief Complaint Chief Complaint  Patient presents with   Nasal Congestion   Headache   Sore Throat    HPI Heather Harding is a 45 y.o. female.   HPI  Nasal Congestion: Patient reports that for the past week she has had nasal congestion, sinus headache and sore throat along with mild cough. She has tried OTC medication for symptoms. No fevers, vomiting or SOB. No sick contacts. She requests influenza and COVID-19 testing as she is going on a trip and wants to ensure she is negative for these for before leaving.No recent hospitalizations or procedures. Perimenopausal with LMP 2 months ago.    Past Medical History:  Diagnosis Date   Asthma    BV (bacterial vaginosis)    GERD (gastroesophageal reflux disease)    Lung abnormality    "right lung underdeveloped"   Yeast infection     Patient Active Problem List   Diagnosis Date Noted   ANEMIA, IRON DEFICIENCY, UNSPEC. 07/05/2006   ASTHMA, UNSPECIFIED 07/05/2006   GASTROESOPHAGEAL REFLUX, NO ESOPHAGITIS 07/05/2006    History reviewed. No pertinent surgical history.  OB History     Gravida  1   Para  0   Term      Preterm      AB  1   Living  0      SAB      IAB  1   Ectopic      Multiple      Live Births  0            Home Medications    Prior to Admission medications   Medication Sig Start Date End Date Taking? Authorizing Provider  albuterol (PROVENTIL HFA;VENTOLIN HFA) 108 (90 Base) MCG/ACT inhaler Inhale 1-2 puffs into the lungs every 6 (six) hours as needed for wheezing or shortness of breath. 07/09/17   Georgetta Haber, NP  BREZTRI AEROSPHERE 160-9-4.8 MCG/ACT AERO Inhale into the lungs. 02/04/21   [provider]  budesonide-formoterol (SYMBICORT) 160-4.5 MCG/ACT inhaler Inhale 2 puffs into the lungs 2 (two) times daily. 02/26/17   Mardella Layman, MD  Cetirizine HCl (ZYRTEC ALLERGY) 10  MG CAPS Take 1 capsule (10 mg total) by mouth daily. 08/22/17   Cathie Hoops, Amy V, PA-C  Cyanocobalamin (VITAMIN B12 PO) Take 1 tablet by mouth daily at 12 noon.    [provider]  Ferrous Sulfate (IRON PO) Take by mouth. Take one tablet daily    [provider]  Magnesium 200 MG TABS Take 1 tablet (200 mg total) by mouth daily. Take with evening meal 10/19/20   Arnette Felts, FNP  metroNIDAZOLE (FLAGYL) 500 MG tablet Take 1 tablet (500 mg total) by mouth 2 (two) times daily. 02/18/21   Boddu, Belenda Cruise, FNP  omeprazole (PRILOSEC) 10 MG capsule Take 10 mg by mouth as needed.    [provider]  Vitamin D, Ergocalciferol, (DRISDOL) 1.25 MG (50000 UNIT) CAPS capsule TAKE 1 CAPSULE BY MOUTH ONCE A WEEK (EVERY  7  (SEVEN)  DAYS) 10/19/20   Arnette Felts, FNP    Family History Family History  Problem Relation Age of Onset   Cancer Paternal Grandfather    Stroke Maternal Grandmother    Stroke Maternal Grandfather    Diabetes Father    Hypertension Sister     Social History Social History   Tobacco Use  Smoking status: Never   Smokeless tobacco: Never  Vaping Use   Vaping Use: Never used  Substance Use Topics   Alcohol use: No   Drug use: No     Allergies   Aspirin, Morphine, Morphine and related, and Codeine   Review of Systems Review of Systems  As stated above in HPI Physical Exam Triage Vital Signs ED Triage Vitals [05/04/21 1203]  Enc Vitals Group     BP (!) 148/78     Pulse Rate 84     Resp 20     Temp 98.9 F (37.2 C)     Temp src      SpO2 98 %     Weight      Height      Head Circumference      Peak Flow      Pain Score      Pain Loc      Pain Edu?      Excl. in GC?    No data found.  Updated Vital Signs BP (!) 148/78    Pulse 84    Temp 98.9 F (37.2 C)    Resp 20    SpO2 98%   Physical Exam Vitals and nursing note reviewed.  Constitutional:      General: She is not in acute distress.    Appearance: She is well-developed. She  is not ill-appearing, toxic-appearing or diaphoretic.  HENT:     Head: Normocephalic and atraumatic.     Right Ear: Tympanic membrane normal. No middle ear effusion. Tympanic membrane is not erythematous.     Left Ear: Tympanic membrane normal.  No middle ear effusion. Tympanic membrane is not erythematous.     Nose: Congestion present. No rhinorrhea.     Mouth/Throat:     Mouth: Mucous membranes are moist.     Pharynx: Oropharynx is clear. Uvula midline. No oropharyngeal exudate, posterior oropharyngeal erythema or uvula swelling.     Tonsils: No tonsillar exudate or tonsillar abscesses.  Eyes:     Conjunctiva/sclera: Conjunctivae normal.     Pupils: Pupils are equal, round, and reactive to light.  Cardiovascular:     Rate and Rhythm: Normal rate and regular rhythm.     Heart sounds: Normal heart sounds.  Pulmonary:     Effort: Pulmonary effort is normal.     Breath sounds: Normal breath sounds.  Musculoskeletal:     Cervical back: Normal range of motion and neck supple.  Lymphadenopathy:     Cervical: No cervical adenopathy.  Skin:    General: Skin is warm.  Neurological:     Mental Status: She is alert and oriented to person, place, and time.     UC Treatments / Results  Labs (all labs ordered are listed, but only abnormal results are displayed) Labs Reviewed  COVID-19, FLU A+B NAA    EKG   Radiology No results found.  Procedures Procedures (including critical care time)  Medications Ordered in UC Medications - No data to display  Initial Impression / Assessment and Plan / UC Course  I have reviewed the triage vital signs and the nursing notes.  Pertinent labs & imaging results that were available during my care of the patient were reviewed by me and considered in my medical decision making (see chart for details).     New. Hcg is negative. Treating for acute bronchitis and potential pneumonia given her asthma history and lung exam- no x ray available today.  Treating with prednisone,  tessalon, flonase, azithromycin. Discussed how to use along with common potential side effects and precautions.    Final Clinical Impressions(s) / UC Diagnoses   Final diagnoses:  Encounter for screening for COVID-19   Discharge Instructions   None    ED Prescriptions   None    PDMP not reviewed this encounter.   Rushie Chestnut, [place] 05/04/21 1231

## 2021-05-04 NOTE — ED Triage Notes (Signed)
Pt here with URI sx for over a week. Would like flu/covid send off test before going home for NYE

## 2021-05-05 LAB — COVID-19, FLU A+B NAA
Influenza A, NAA: NOT DETECTED
Influenza B, NAA: NOT DETECTED
SARS-CoV-2, NAA: NOT DETECTED

## 2021-05-24 ENCOUNTER — Encounter: Payer: Self-pay | Admitting: Nurse Practitioner

## 2021-05-27 ENCOUNTER — Encounter: Payer: Self-pay | Admitting: Nurse Practitioner

## 2021-05-27 ENCOUNTER — Other Ambulatory Visit: Payer: Self-pay | Admitting: Nurse Practitioner

## 2021-05-27 ENCOUNTER — Ambulatory Visit (INDEPENDENT_AMBULATORY_CARE_PROVIDER_SITE_OTHER): Payer: BC Managed Care – PPO | Admitting: Nurse Practitioner

## 2021-05-27 ENCOUNTER — Other Ambulatory Visit: Payer: Self-pay

## 2021-05-27 ENCOUNTER — Ambulatory Visit
Admission: RE | Admit: 2021-05-27 | Discharge: 2021-05-27 | Disposition: A | Discharge status: Home / Self Care | Payer: BC Managed Care – PPO | Source: Ambulatory Visit | Attending: Nurse Practitioner | Admitting: Nurse Practitioner

## 2021-05-27 VITALS — BP 132/84 | HR 75 | Temp 98.2°F | Ht 64.2 in | Wt 231.6 lb

## 2021-05-27 DIAGNOSIS — R0602 Shortness of breath: Secondary | ICD-10-CM | POA: Diagnosis not present

## 2021-05-27 DIAGNOSIS — Z2821 Immunization not carried out because of patient refusal: Secondary | ICD-10-CM

## 2021-05-27 DIAGNOSIS — G43009 Migraine without aura, not intractable, without status migrainosus: Secondary | ICD-10-CM

## 2021-05-27 NOTE — Progress Notes (Signed)
I,Katawbba Wiggins,acting as a Neurosurgeonscribe for SUPERVAL[employer] INCJanece Consuella Scurlock, FNP.,have documented all relevant documentation on the behalf of Arnette FeltsJanece Paytan Recine, FNP,as directed by  Arnette FeltsJanece Crystel Demarco, FNP while in the presence of Arnette FeltsJanece Devi Hopman, FNP.  This visit occurred during the SARS-CoV-2 public health emergency.  Safety protocols were in place, including screening questions prior to the visit, additional usage of staff PPE, and extensive cleaning of exam room while observing appropriate contact time as indicated for disinfecting solutions.  Subjective:     Patient ID: Heather Harding , female    DOB: 1975-09-14 , 46 y.o.   MRN: [id number]   Chief Complaint  Patient presents with   Follow-up    HPI  She was seen in urgent care in December.  She reports she has been getting short of breath with walking after being sick with URI. She reports when she had covid before and had shortness of breath.  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 Unable to lift more than 20 lbs.   [facility] taking breztri and albuterol inhaler daily - more than once a day. At least 2-3 times a day.     Past Medical History:  Diagnosis Date   Asthma    BV (bacterial vaginosis)    GERD (gastroesophageal reflux disease)    Lung abnormality    "right lung underdeveloped"   Yeast infection      Family History  Problem Relation Age of Onset   Cancer Paternal Grandfather    Stroke Maternal Grandmother    Stroke Maternal Grandfather    Diabetes Father    Hypertension Sister      Current Outpatient Medications:    albuterol (VENTOLIN HFA) 108 (90 Base) MCG/ACT inhaler, Inhale 1-2 puffs into the lungs every 6 (six) hours as needed for wheezing or shortness of breath., Disp: 1 each, Rfl: 0   benzonatate (TESSALON) 100 MG capsule, Take 1 capsule (100 mg total) by mouth every 8 (eight)  hours., Disp: 21 capsule, Rfl: 0   BREZTRI AEROSPHERE 160-9-4.8 MCG/ACT AERO, Inhale into the lungs., Disp: , Rfl:    Cetirizine HCl (ZYRTEC ALLERGY) 10 MG CAPS, Take 1 capsule (10 mg total) by mouth daily., Disp: 30 capsule, Rfl: 0   Cyanocobalamin (VITAMIN B12 PO), Take 1 tablet by mouth daily at 12 noon., Disp: , Rfl:    Ferrous Sulfate (IRON PO), Take by mouth. Take one tablet daily, Disp: , Rfl:    fluticasone (FLONASE) 50 MCG/ACT nasal spray, Place 2 sprays into both nostrils daily., Disp: 16 mL, Rfl: 0   Magnesium 200 MG TABS, Take 1 tablet (200 mg total) by mouth daily. Take with evening meal, Disp: 30 tablet, Rfl: 5   metroNIDAZOLE (FLAGYL) 500 MG tablet, Take 1 tablet (500 mg total) by mouth 2 (two) times daily., Disp: 14 tablet, Rfl: 0   omeprazole (PRILOSEC) 10 MG capsule, Take 10 mg by mouth as needed., Disp: , Rfl:    Vitamin D, Ergocalciferol, (DRISDOL) 1.25 MG (50000 UNIT) CAPS capsule, Take 1 capsule by mouth once a week, Disp: 12 capsule, Rfl: 0   Allergies  Allergen Reactions   Aspirin Other (See Comments)    Other Reaction: RYE   Morphine Other (See Comments)    Family hx of allergy   Morphine And Related    Codeine  Rash     Review of Systems  Constitutional: Negative.   Respiratory:  Positive for apnea (with physical exerction), shortness of breath and wheezing.   Cardiovascular: Negative.   Gastrointestinal: Negative.   Psychiatric/Behavioral: Negative.    All other systems reviewed and are negative.   Today's Vitals   05/27/21 1154  BP: 132/84  Pulse: 75  Temp: 98.2 F (36.8 C)  SpO2: 97%  Weight: 231 lb 9.6 oz (105.1 kg)  Height: 5' 4.2" (1.631 m)   Body mass index is 39.51 kg/m.  Wt Readings from Last 3 Encounters:  05/27/21 231 lb 9.6 oz (105.1 kg)  10/19/20 231 lb 9.6 oz (105.1 kg)  05/05/20 234 lb 12.8 oz (106.5 kg)    BP Readings from Last 3 Encounters:  05/27/21 132/84  05/04/21 (!) 148/78  03/30/21 (!) 158/99    Objective:  Physical  Exam Vitals reviewed.  Constitutional:      General: She is not in acute distress.    Appearance: Normal appearance. She is obese.  Cardiovascular:     Rate and Rhythm: Normal rate and regular rhythm.     Pulses: Normal pulses.     Heart sounds: Normal heart sounds. No murmur heard. Pulmonary:     Effort: Pulmonary effort is normal. No respiratory distress.     Breath sounds: Normal breath sounds. No wheezing.  Neurological:     General: No focal deficit present.     Mental Status: She is alert and oriented to person, place, and time.     Cranial Nerves: No cranial nerve deficit.     Motor: No weakness.  Psychiatric:        Mood and Affect: Mood normal.        Behavior: Behavior normal.        Thought Content: Thought content normal.        Judgment: Judgment normal.        Assessment And Plan:     1. Shortness of breath Comments: SpO2 is 97% on RA. She is having more difficulty more at work at [employer] with walking long distances. Call her pulmologist to see if need change in med. I will provide her a note for 3 months to allow time to improve. She is to go for CXR and pending results will check other imaging - DG Chest 2 View; Future  2. COVID-19 vaccination declined Declines covid 19 vaccine. Discussed risk of covid 9 and if she changes her mind about the vaccine to call the office.  Encouraged to take multivitamin, vitamin d, vitamin c and zinc to increase immune system. Aware can call office if would like to have vaccine here at office.    3. Influenza vaccination declined Patient declined influenza vaccination at this time. Patient is aware that influenza vaccine prevents illness in 70% of healthy people, and reduces hospitalizations to 30-70% in elderly. This vaccine is recommended annually. Pt is willing to accept risk associated with refusing vaccination.    Patient was given opportunity to ask questions. Patient verbalized understanding of the plan and was able to  repeat key elements of the plan. All questions were answered to their satisfaction.  Arnette Felts, FNP   I, Arnette Felts, FNP, have reviewed all documentation for this visit. The documentation on 05/27/21 for the exam, diagnosis, procedures, and orders are all accurate and complete.   IF YOU HAVE BEEN REFERRED TO A SPECIALIST, IT MAY TAKE 1-2 WEEKS TO SCHEDULE/PROCESS THE REFERRAL. IF YOU HAVE NOT HEARD  FROM US/SPECIALIST IN TWO WEEKS, PLEASE GIVE [place] A CALL AT [phone].   THE PATIENT IS ENCOURAGED TO PRACTICE SOCIAL DISTANCING DUE TO THE COVID-19 PANDEMIC.

## 2021-05-27 NOTE — Patient Instructions (Signed)
[email or web address] - for covid home tests

## 2021-06-03 ENCOUNTER — Telehealth: Payer: Self-pay

## 2021-06-03 NOTE — Telephone Encounter (Signed)
Pt notified that her health form has been completed, faxed and a copy was sent to her email on file.  A copy has also been kept for her chart.

## 2021-06-13 ENCOUNTER — Encounter: Payer: Self-pay | Admitting: Nurse Practitioner

## 2021-07-11 ENCOUNTER — Other Ambulatory Visit: Payer: Self-pay | Admitting: Nurse Practitioner

## 2021-07-11 DIAGNOSIS — E559 Vitamin D deficiency, unspecified: Secondary | ICD-10-CM

## 2021-08-09 ENCOUNTER — Other Ambulatory Visit: Payer: Self-pay

## 2021-08-09 ENCOUNTER — Telehealth (INDEPENDENT_AMBULATORY_CARE_PROVIDER_SITE_OTHER): Payer: BC Managed Care – PPO | Admitting: Nurse Practitioner

## 2021-08-09 ENCOUNTER — Encounter: Payer: Self-pay | Admitting: Nurse Practitioner

## 2021-08-09 DIAGNOSIS — E559 Vitamin D deficiency, unspecified: Secondary | ICD-10-CM

## 2021-08-09 DIAGNOSIS — G43009 Migraine without aura, not intractable, without status migrainosus: Secondary | ICD-10-CM

## 2021-08-09 MED ORDER — MAGNESIUM 200 MG PO TABS: ORAL_TABLET | ORAL | 0 refills | Status: DC

## 2021-08-09 MED ORDER — VITAMIN D (ERGOCALCIFEROL) 1.25 MG (50000 UNIT) PO CAPS: 50000.0000 [IU] | ORAL_CAPSULE | ORAL | 0 refills | Status: DC

## 2021-08-09 NOTE — Patient Instructions (Signed)
Migraine Headache A migraine headache is a very strong throbbing pain on one side or both sides of your head. This type of headache can also cause other symptoms. It can last from 4 hours to 3 days. Talk with your doctor about what things may bring on (trigger) this condition. What are the causes? The exact cause of this condition is not known. This condition may be triggered or caused by: Drinking alcohol. Smoking. Taking medicines, such as: Medicine used to treat chest pain (nitroglycerin). Birth control pills. Estrogen. Some blood pressure medicines. Eating or drinking certain products. Doing physical activity. Other things that may trigger a migraine headache include: Having a menstrual period. Pregnancy. Hunger. Stress. Not getting enough sleep or getting too much sleep. Weather changes. Tiredness (fatigue). What increases the risk? Being 25-55 years old. Being female. Having a family history of migraine headaches. Being Caucasian. Having depression or anxiety. Being very overweight. What are the signs or symptoms? A throbbing pain. This pain may: Happen in any area of the head, such as on one side or both sides. Make it hard to do daily activities. Get worse with physical activity. Get worse around bright lights or loud noises. Other symptoms may include: Feeling sick to your stomach (nauseous). Vomiting. Dizziness. Being sensitive to bright lights, loud noises, or smells. Before you get a migraine headache, you may get warning signs (an aura). An aura may include: Seeing flashing lights or having blind spots. Seeing bright spots, halos, or zigzag lines. Having tunnel vision or blurred vision. Having numbness or a tingling feeling. Having trouble talking. Having weak muscles. Some people have symptoms after a migraine headache (postdromal phase), such as: Tiredness. Trouble thinking (concentrating). How is this treated? Taking medicines that: Relieve  pain. Relieve the feeling of being sick to your stomach. Prevent migraine headaches. Treatment may also include: Having acupuncture. Avoiding foods that bring on migraine headaches. Learning ways to control your body functions (biofeedback). Therapy to help you know and deal with negative thoughts (cognitive behavioral therapy). Follow these instructions at home: Medicines Take over-the-counter and prescription medicines only as told by your doctor. Ask your doctor if the medicine prescribed to you: Requires you to avoid driving or using heavy machinery. Can cause trouble pooping (constipation). You may need to take these steps to prevent or treat trouble pooping: Drink enough fluid to keep your pee (urine) pale yellow. Take over-the-counter or prescription medicines. Eat foods that are high in fiber. These include beans, whole grains, and fresh fruits and vegetables. Limit foods that are high in fat and sugar. These include fried or sweet foods. Lifestyle Do not drink alcohol. Do not use any products that contain nicotine or tobacco, such as cigarettes, e-cigarettes, and chewing tobacco. If you need help quitting, ask your doctor. Get at least 8 hours of sleep every night. Limit and deal with stress. General instructions   Keep a journal to find out what may bring on your migraine headaches. For example, write down: What you eat and drink. How much sleep you get. Any change in what you eat or drink. Any change in your medicines. If you have a migraine headache: Avoid things that make your symptoms worse, such as bright lights. It may help to lie down in a dark, quiet room. Do not drive or use heavy machinery. Ask your doctor what activities are safe for you. Keep all follow-up visits as told by your doctor. This is important. Contact a doctor if: You get a migraine headache that   is different or worse than others you have had. You have more than 15 headache days in one  month. Get help right away if: Your migraine headache gets very bad. Your migraine headache lasts longer than 72 hours. You have a fever. You have a stiff neck. You have trouble seeing. Your muscles feel weak or like you cannot control them. You start to lose your balance a lot. You start to have trouble walking. You pass out (faint). You have a seizure. Summary A migraine headache is a very strong throbbing pain on one side or both sides of your head. These headaches can also cause other symptoms. This condition may be treated with medicines and changes to your lifestyle. Keep a journal to find out what may bring on your migraine headaches. Contact a doctor if you get a migraine headache that is different or worse than others you have had. Contact your doctor if you have more than 15 headache days in a month. This information is not intended to replace advice given to you by your health care provider. Make sure you discuss any questions you have with your health care provider. Document Revised: 08/16/2018 Document Reviewed: 06/06/2018 Elsevier Patient Education  2022 [employer] Inc[employer]  

## 2021-08-09 NOTE — Progress Notes (Signed)
? ?Virtual Visit via Mychart  ? ?This visit type was conducted due to national recommendations for restrictions regarding the COVID-19 Pandemic (e.g. social distancing) in an effort to limit this patient's exposure and mitigate transmission in our community.  Due to her co-morbid illnesses, this patient is at least at moderate risk for complications without adequate follow up.  This format is felt to be most appropriate for this patient at this time.  All issues noted in this document were discussed and addressed.  A limited physical exam was performed with this format.   ? ?This visit type was conducted due to national recommendations for restrictions regarding the COVID-19 Pandemic (e.g. social distancing) in an effort to limit this patient's exposure and mitigate transmission in our community.  Patients identity confirmed using two different identifiers.  This format is felt to be most appropriate for this patient at this time.  All issues noted in this document were discussed and addressed.  No physical exam was performed (except for noted visual exam findings with Video Visits).   ? ?Date:  08/09/2021  ? ?ID:  AHNYA AKRE, DOB [birth year], MRN [id number] ? ?Patient Location:  ?Home - spoke with Johnney Killian ? ?Provider location:   ?Office ? ? ? ?Chief Complaint:  migraine over the weekend ? ?History of Present Illness:   ? ?SHLONDA DOLLOFF is a 46 y.o. female who presents via video conferencing for a telehealth visit today.   ? ?The patient does not have symptoms concerning for COVID-19 infection (fever, chills, cough, or new shortness of breath).  ? ?Patient presents for treatment of migraine this weekend, had nausea, photophobia and sensitivity to smell.  She does not feel like she was dehydrated.  She had been drinking adequate amounts of water. Today she is feeling better. Ate oatmeal this morning and had small amount of nausea. She is trying to not take any medications.  ? 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 ?Migraine  ?The pain is located in the Bilateral region. Associated symptoms include nausea.   ? ?Past Medical History:  ?Diagnosis Date  ? Asthma   ? BV (bacterial vaginosis)   ? GERD (gastroesophageal reflux disease)   ? Lung abnormality   ? "right lung underdeveloped"  ? Yeast infection   ? ?No past surgical history on file.  ? ?Current Meds  ?Medication Sig  ? albuterol (VENTOLIN HFA) 108 (90 Base) MCG/ACT inhaler Inhale 1-2 puffs into the lungs every 6 (six) hours as needed for wheezing or shortness of breath.  ? benzonatate (TESSALON) 100 MG capsule Take 1 capsule (100 mg total) by mouth every 8 (eight) hours.  ? BREZTRI AEROSPHERE 160-9-4.8 MCG/ACT AERO Inhale into the lungs.  ? Cetirizine HCl (ZYRTEC ALLERGY) 10 MG CAPS Take 1 capsule (10 mg total) by mouth daily.  ? Cyanocobalamin (VITAMIN B12 PO) Take 1 tablet by mouth daily at 12 noon.  ? Ferrous Sulfate (IRON PO) Take by mouth. Take one tablet daily  ? fluticasone (FLONASE) 50 MCG/ACT nasal spray Place 2 sprays into both nostrils daily.  ? Magnesium 200 MG TABS TAKE 1 TABLET BY MOUTH ONCE DAILY WITH EVENING MEAL  ? omeprazole (PRILOSEC) 10 MG capsule Take 10 mg by mouth as needed.  ? Vitamin D, Ergocalciferol, (DRISDOL) 1.25 MG (50000 UNIT) CAPS capsule Take 1 capsule (50,000 Units total) by mouth once a week.  ?  ? ?Allergies:   Aspirin, Morphine, Morphine and related, and Codeine  ? ?  Social History  ? ?Tobacco Use  ? Smoking status: Never  ? Smokeless tobacco: Never  ?Vaping Use  ? Vaping Use: Never used  ?Substance Use Topics  ? Alcohol use: No  ? Drug use: No  ?  ? ?Family Hx: ?The patient's family history includes Cancer in her paternal grandfather; Diabetes in her father; Hypertension in her sister; Stroke in her maternal grandfather and maternal grandmother. ? ?ROS:   ?Please see the history of present illness.    ?Review of Systems  ?Constitutional: Negative.   ?Respiratory: Negative.     ?Cardiovascular: Negative.   ?Gastrointestinal:  Positive for nausea.  ?Neurological:  Positive for headaches.  ?Psychiatric/Behavioral: Negative.     ?All other systems reviewed and are negative. ? ? ?Labs/Other Tests and Data Reviewed:   ? ?Recent Labs: ?10/18/2020: ALT 16; BUN 9; Creatinine, Ser 0.88; Hemoglobin 11.3; Platelets 273; Potassium 4.6; Sodium 140  ? ?Recent Lipid Panel ?Lab Results  ?Component Value Date/Time  ? CHOL 185 10/18/2020 10:25 AM  ? TRIG 77 10/18/2020 10:25 AM  ? HDL 73 10/18/2020 10:25 AM  ? CHOLHDL 2.5 10/18/2020 10:25 AM  ? LDLCALC 98 10/18/2020 10:25 AM  ? ? ?Wt Readings from Last 3 Encounters:  ?05/27/21 231 lb 9.6 oz (105.1 kg)  ?10/19/20 231 lb 9.6 oz (105.1 kg)  ?05/05/20 234 lb 12.8 oz (106.5 kg)  ?  ? ?Exam:   ? ?Vital Signs:  There were no vitals taken for this visit.  ? ? ?Physical Exam ?Vitals reviewed.  ?Constitutional:   ?   General: She is not in acute distress. ?   Appearance: Normal appearance.  ?Pulmonary:  ?   Effort: Pulmonary effort is normal. No respiratory distress.  ?Neurological:  ?   General: No focal deficit present.  ?   Mental Status: She is alert and oriented to person, place, and time. Mental status is at baseline.  ?   Cranial Nerves: No cranial nerve deficit.  ?Psychiatric:     ?   Mood and Affect: Mood and affect normal.     ?   Behavior: Behavior normal.     ?   Thought Content: Thought content normal.     ?   Cognition and Memory: Memory normal.     ?   Judgment: Judgment normal.  ? ? ?ASSESSMENT & PLAN:   ? ?1. Migraine without aura and without status migrainosus, not intractable ?Will check vitamin B12, encouraged to stay well hydrated with water as well. Headache is now improving. She will come for labs later today ?- Vitamin B12 ? ?2. Vitamin D deficiency ?Will check vitamin D level and supplement as needed.    ?Also encouraged to spend 15 minutes in the sun daily.  ?- VITAMIN D 25 Hydroxy (Vit-D Deficiency, Fractures) ? ? ?COVID-19 Education: ?The  signs and symptoms of COVID-19 were discussed with the patient and how to seek care for testing (follow up with PCP or arrange E-visit).  The importance of social distancing was discussed today. ? ?Patient Risk:   ?After full review of this patients clinical status, I feel that they are at least moderate risk at this time. ? ?Time:   ?Today, I have spent 10.29 minutes/ seconds with the patient with telehealth technology discussing above diagnoses.   ? ? ?Medication Adjustments/Labs and Tests Ordered: ?Current medicines are reviewed at length with the patient today.  Concerns regarding medicines are outlined above.  ? ?Tests Ordered: ?No orders of the defined types were  placed in this encounter. ? ? ?Medication Changes: ?No orders of the defined types were placed in this encounter. ? ? ?Disposition:  Follow up prn ? ?Signed, ?Tianna Badgett  ? ?

## 2021-08-10 LAB — VITAMIN D 25 HYDROXY (VIT D DEFICIENCY, FRACTURES): Vit D, 25-Hydroxy: 38.6 ng/mL (ref 30.0–100.0)

## 2021-08-10 LAB — VITAMIN B12: Vitamin B-12: 1048 pg/mL (ref 232–1245)

## 2021-10-20 ENCOUNTER — Encounter: Payer: Medicare Other | Admitting: Nurse Practitioner

## 2021-11-07 ENCOUNTER — Encounter: Payer: Medicare Other | Admitting: Nurse Practitioner

## 2022-01-16 ENCOUNTER — Other Ambulatory Visit: Payer: Self-pay | Admitting: Nurse Practitioner

## 2022-01-16 DIAGNOSIS — E559 Vitamin D deficiency, unspecified: Secondary | ICD-10-CM

## 2022-04-01 IMAGING — MG MM DIGITAL DIAGNOSTIC UNILAT*L* W/ TOMO W/ CAD
4 series · 4 of 12 positions shown · non-contrast
Comparison: Previous exam(s).
COMPARISON: Previous exam(s).

Addendum:
CLINICAL DATA: Patient returns after screening study for evaluation
of possible LEFT breast mass.

EXAM:
DIGITAL DIAGNOSTIC UNILATERAL LEFT MAMMOGRAM WITH TOMOSYNTHESIS AND
CAD
TECHNIQUE: Left digital diagnostic mammography and breast tomosynthesis was
performed. The images were evaluated with computer-aided detection.

[L MLO synth-2D]
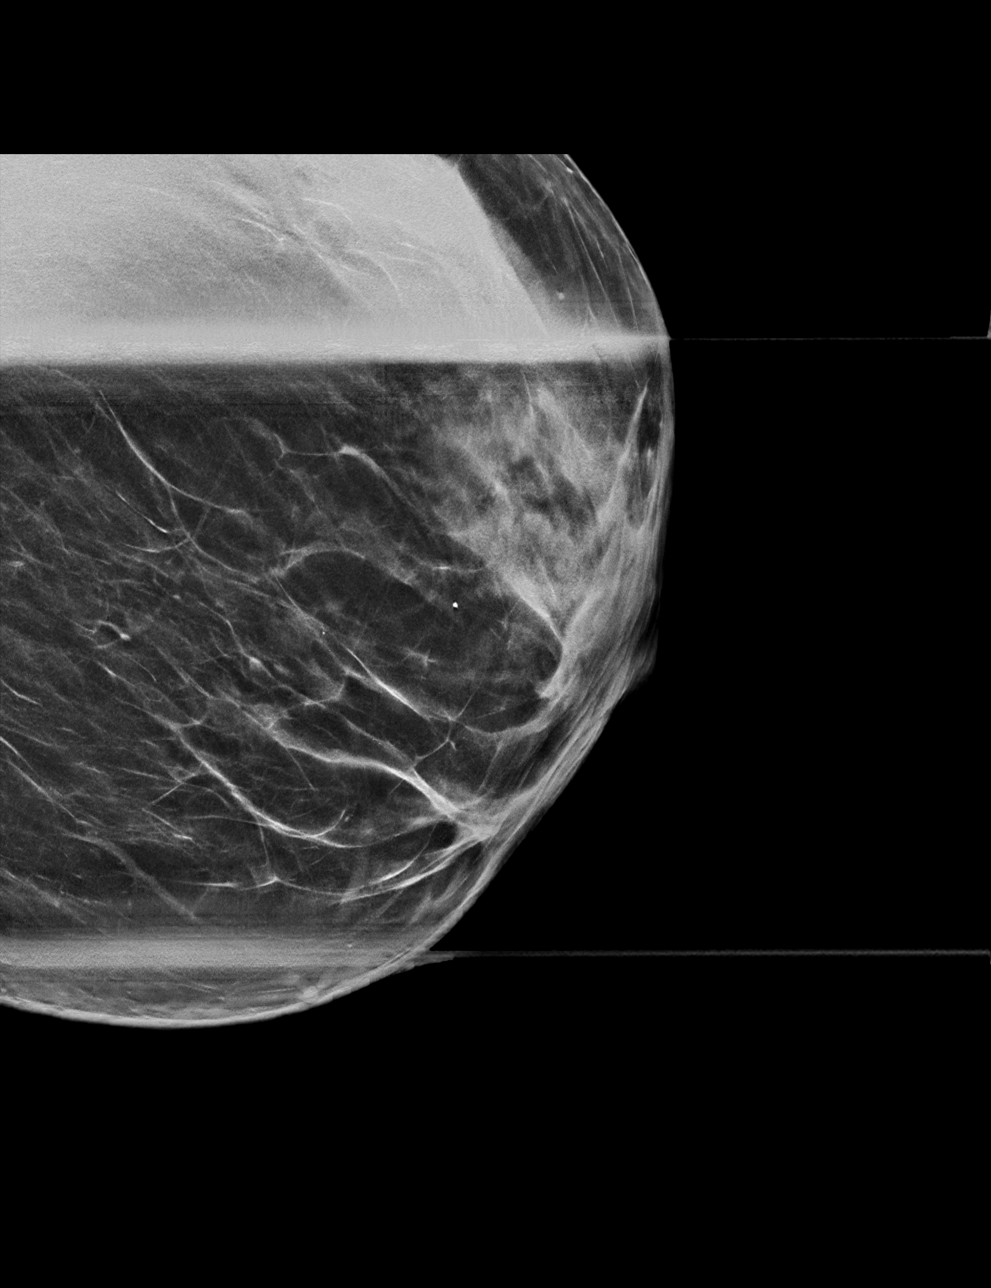

[L CC synth-2D]
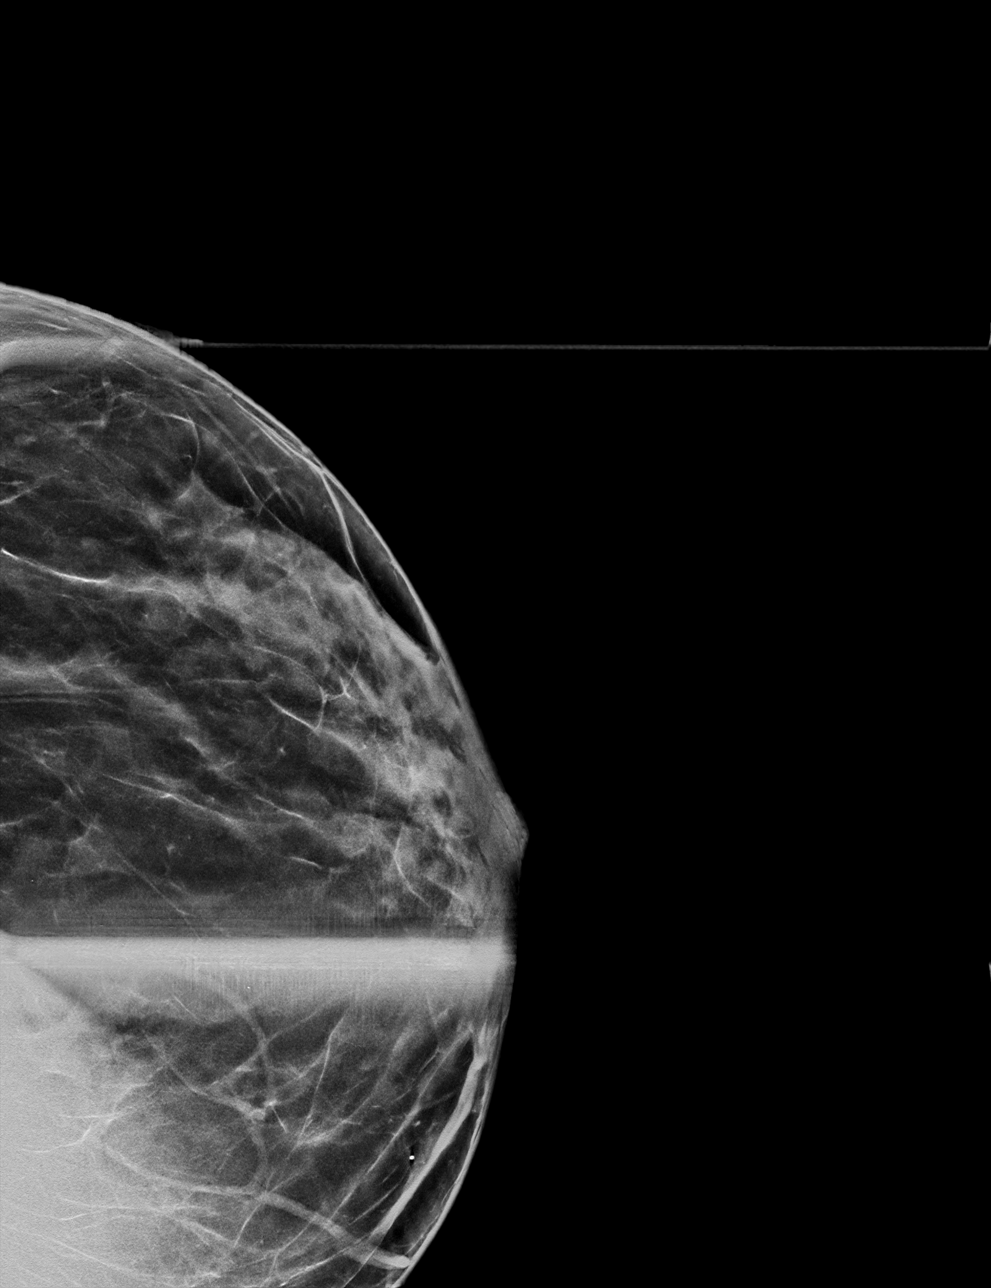

[L MLO tomo · tomo slice 29/56.0]
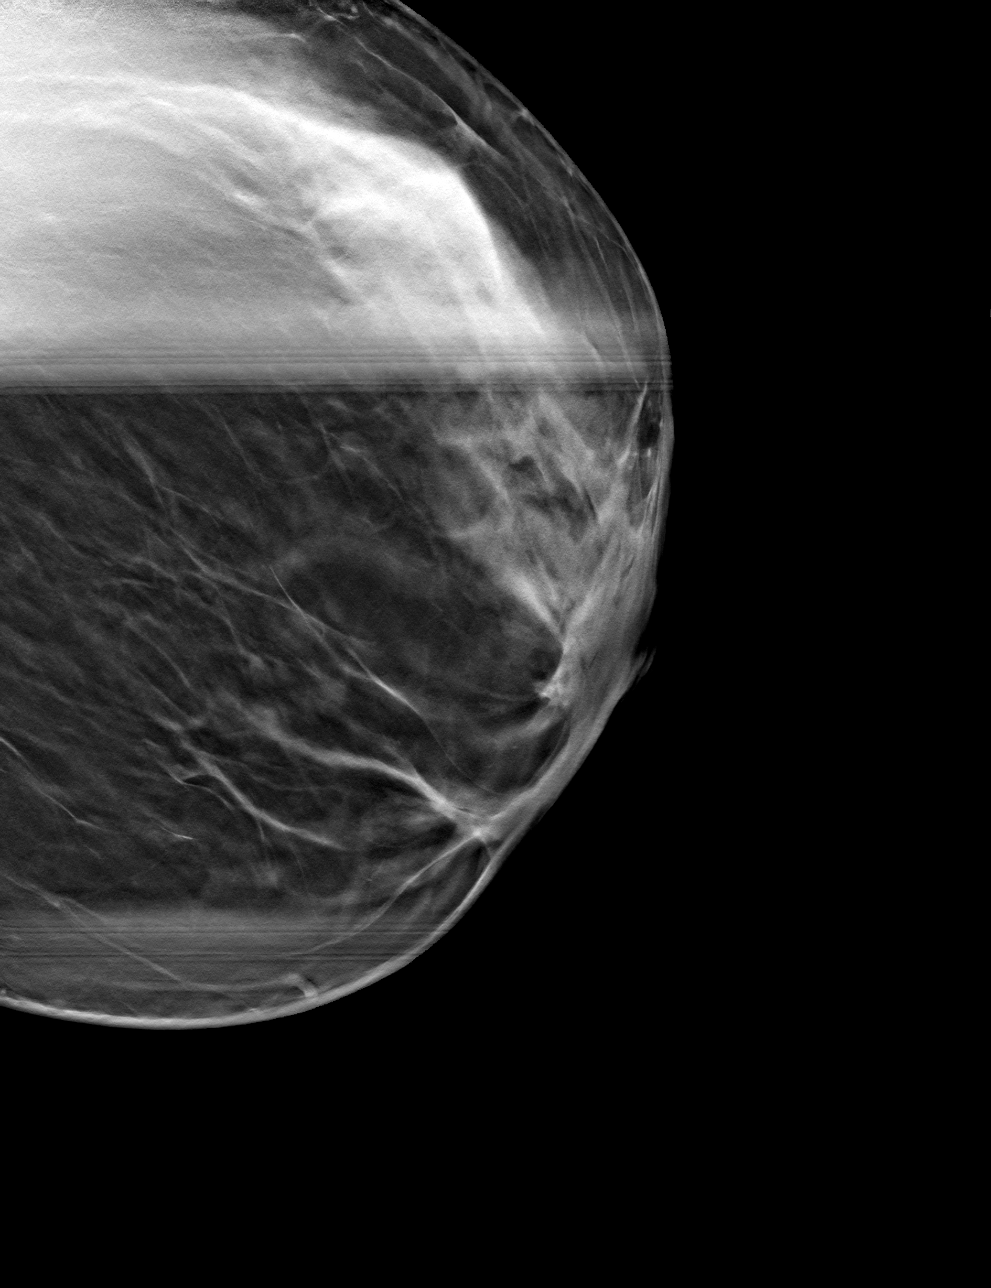

[L CC tomo · tomo slice 27/52.0]
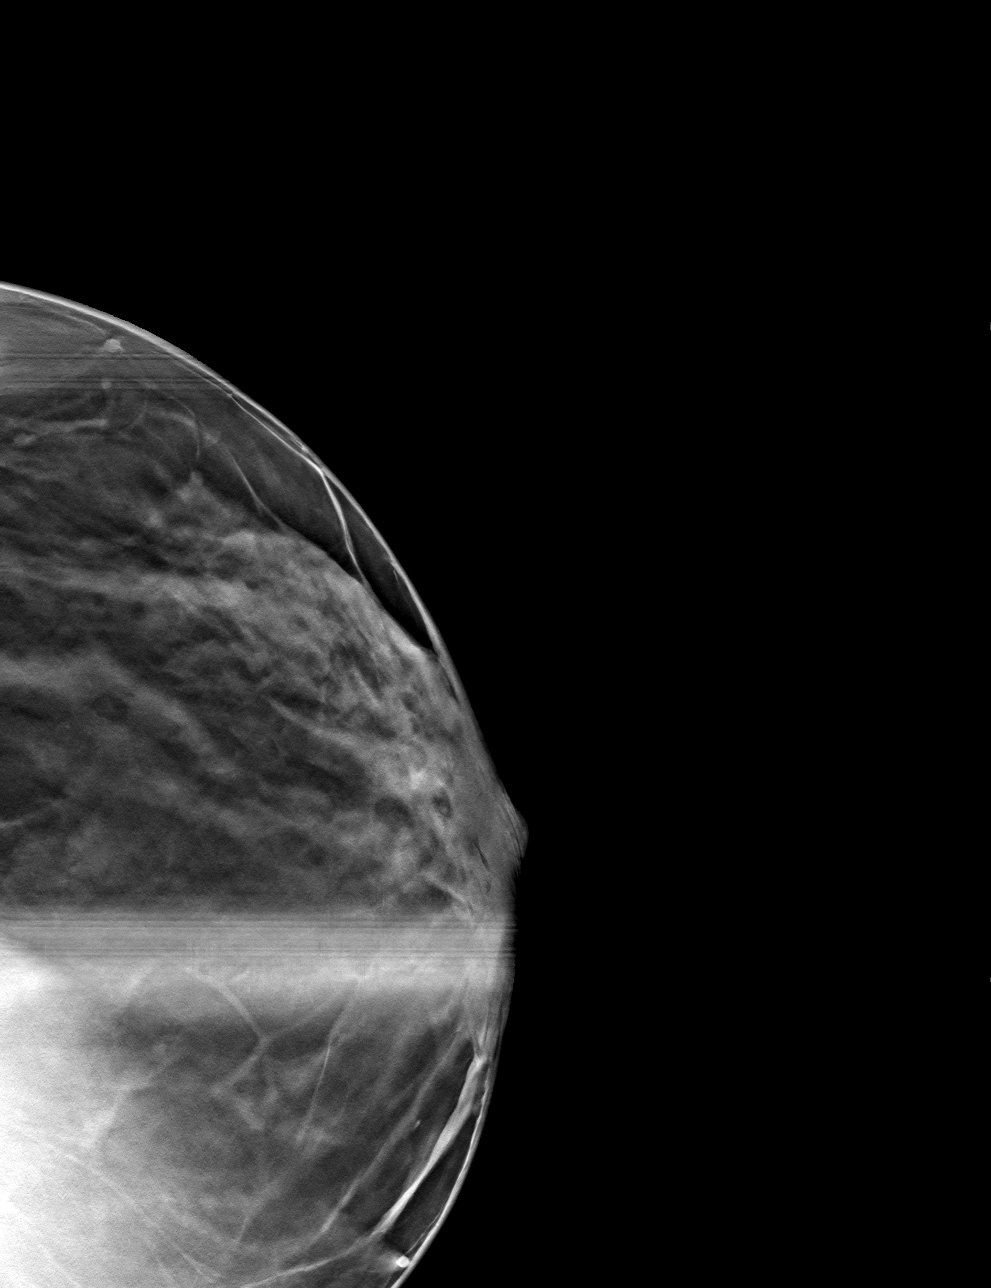

[4 of 12 positions shown; findings below may reference images not displayed]

ACR Breast Density Category c: The breast tissue is heterogeneously
dense, which may obscure small masses.
FINDINGS: Additional 2-D and 3-D images are performed. These views confirm
presence of an oval circumscribed mass in the LATERAL portion of the
LEFT breast.

Targeted ultrasound is performed, showing a simple cyst in the 3
o'clock location of the LEFT breast 2 centimeters from the nipple
measuring 0.8 x 0.6 x 0.5 centimeters. No solid component or
internal blood flow.
IMPRESSION: Benign cyst in the LEFT breast. No mammographic or ultrasound
evidence for malignancy.

RECOMMENDATION:
Screening mammogram in one year.(Code:9U-9-W99)

I have discussed the findings and recommendations with the patient.
If applicable, a reminder letter will be sent to the patient
regarding the next appointment.

BI-RADS CATEGORY  2: Benign.

ADDENDUM:
LEFT breast ultrasound was performed at the time of LEFT diagnostic
mammogram.

*** End of Addendum ***
ACR Breast Density Category c: The breast tissue is heterogeneously
dense, which may obscure small masses.
FINDINGS: Additional 2-D and 3-D images are performed. These views confirm
presence of an oval circumscribed mass in the LATERAL portion of the
LEFT breast.

Targeted ultrasound is performed, showing a simple cyst in the 3
o'clock location of the LEFT breast 2 centimeters from the nipple
measuring 0.8 x 0.6 x 0.5 centimeters. No solid component or
internal blood flow.
IMPRESSION: Benign cyst in the LEFT breast. No mammographic or ultrasound
evidence for malignancy.

RECOMMENDATION:
Screening mammogram in one year.(Code:9U-9-W99)

I have discussed the findings and recommendations with the patient.
If applicable, a reminder letter will be sent to the patient
regarding the next appointment.

BI-RADS CATEGORY  2: Benign.

## 2022-04-11 ENCOUNTER — Other Ambulatory Visit
Admission: RE | Admit: 2022-04-11 | Discharge: 2022-04-11 | Disposition: A | Discharge status: Home / Self Care | Payer: BC Managed Care – PPO | Source: Ambulatory Visit | Attending: Urgent Care | Admitting: Urgent Care

## 2022-04-11 ENCOUNTER — Ambulatory Visit
Admission: EM | Admit: 2022-04-11 | Discharge: 2022-04-11 | Disposition: A | Discharge status: Home / Self Care | Payer: BC Managed Care – PPO | Attending: Urgent Care | Admitting: Urgent Care

## 2022-04-11 DIAGNOSIS — R6889 Other general symptoms and signs: Secondary | ICD-10-CM | POA: Diagnosis not present

## 2022-04-11 DIAGNOSIS — Z1152 Encounter for screening for COVID-19: Secondary | ICD-10-CM | POA: Diagnosis present

## 2022-04-11 LAB — RESP PANEL BY RT-PCR (RSV, FLU A&B, COVID)  RVPGX2
Influenza A by PCR: NEGATIVE
Influenza B by PCR: NEGATIVE
Resp Syncytial Virus by PCR: NEGATIVE
SARS Coronavirus 2 by RT PCR: NEGATIVE

## 2022-04-11 NOTE — ED Provider Notes (Signed)
Renaldo Fiddler    CSN: [id number] Arrival date & time: 04/11/22  1553      History   Chief Complaint Chief Complaint  Patient presents with   Cough   Nausea   Chills   Headache    HPI KAJSA BUTRUM is a 46 y.o. female.    Cough Associated symptoms: headaches   Headache Associated symptoms: cough     Presents to urgent care with complaint of cough, nausea, chills, headache x 4 days.  She has history of headache which she usually treats with acetaminophen.  She believes she has fever but no documentation.  Past Medical History:  Diagnosis Date   Asthma    BV (bacterial vaginosis)    GERD (gastroesophageal reflux disease)    Lung abnormality    "right lung underdeveloped"   Yeast infection     Patient Active Problem List   Diagnosis Date Noted   Shortness of breath 05/27/2021   ANEMIA, IRON DEFICIENCY, UNSPEC. 07/05/2006   ASTHMA, UNSPECIFIED 07/05/2006   GASTROESOPHAGEAL REFLUX, NO ESOPHAGITIS 07/05/2006    History reviewed. No pertinent surgical history.  OB History     Gravida  1   Para  0   Term      Preterm      AB  1   Living  0      SAB      IAB  1   Ectopic      Multiple      Live Births  0            Home Medications    Prior to Admission medications   Medication Sig Start Date End Date Taking? Authorizing Provider  albuterol (VENTOLIN HFA) 108 (90 Base) MCG/ACT inhaler Inhale 1-2 puffs into the lungs every 6 (six) hours as needed for wheezing or shortness of breath. 05/04/21   Rushie Chestnut, PA-C  benzonatate (TESSALON) 100 MG capsule Take 1 capsule (100 mg total) by mouth every 8 (eight) hours. 05/04/21   Rushie Chestnut, PA-C  BREZTRI AEROSPHERE 160-9-4.8 MCG/ACT AERO Inhale into the lungs. 02/04/21   [provider]  Cetirizine HCl (ZYRTEC ALLERGY) 10 MG CAPS Take 1 capsule (10 mg total) by mouth daily. 08/22/17   Cathie Hoops, Amy V, PA-C  Cyanocobalamin (VITAMIN B12 PO) Take 1 tablet by mouth daily  at 12 noon.    [provider]  Ferrous Sulfate (IRON PO) Take by mouth. Take one tablet daily    [provider]  fluticasone (FLONASE) 50 MCG/ACT nasal spray Place 2 sprays into both nostrils daily. 05/04/21   Rushie Chestnut, PA-C  Magnesium 200 MG TABS TAKE 1 TABLET BY MOUTH ONCE DAILY WITH EVENING MEAL 08/09/21   Arnette Felts, FNP  omeprazole (PRILOSEC) 10 MG capsule Take 10 mg by mouth as needed.    [provider]  Vitamin D, Ergocalciferol, (DRISDOL) 1.25 MG (50000 UNIT) CAPS capsule Take 1 capsule by mouth once a week 01/16/22   Arnette Felts, FNP    Family History Family History  Problem Relation Age of Onset   Cancer Paternal Grandfather    Stroke Maternal Grandmother    Stroke Maternal Grandfather    Diabetes Father    Hypertension Sister     Social History Social History   Tobacco Use   Smoking status: Never   Smokeless tobacco: Never  Vaping Use   Vaping Use: Never used  Substance Use Topics   Alcohol use: No   Drug use:  No     Allergies   Aspirin, Morphine, Morphine and related, and Codeine   Review of Systems Review of Systems  Respiratory:  Positive for cough.   Neurological:  Positive for headaches.     Physical Exam Triage Vital Signs ED Triage Vitals [04/11/22 1626]  Enc Vitals Group     BP (!) 144/98     Pulse Rate 86     Resp 16     Temp 98.3 F (36.8 C)     Temp src      SpO2 98 %     Weight      Height      Head Circumference      Peak Flow      Pain Score 0     Pain Loc      Pain Edu?      Excl. in GC?    No data found.  Updated Vital Signs BP (!) 144/98   Pulse 86   Temp 98.3 F (36.8 C)   Resp 16   LMP 02/24/2022 (Approximate)   SpO2 98%   Visual Acuity Right Eye Distance:   Left Eye Distance:   Bilateral Distance:    Right Eye Near:   Left Eye Near:    Bilateral Near:     Physical Exam Vitals reviewed.  Constitutional:      Appearance: She is well-developed. She is  ill-appearing.  Cardiovascular:     Rate and Rhythm: Normal rate and regular rhythm.     Heart sounds: Normal heart sounds.  Pulmonary:     Effort: Pulmonary effort is normal.     Breath sounds: Normal breath sounds.  Abdominal:     General: Bowel sounds are normal.     Palpations: Abdomen is soft.  Neurological:     Mental Status: She is alert and oriented to person, place, and time.  Psychiatric:        Mood and Affect: Mood normal.        Behavior: Behavior normal.      UC Treatments / Results  Labs (all labs ordered are listed, but only abnormal results are displayed) Labs Reviewed  RESP PANEL BY RT-PCR (FLU A&B, COVID) [id number]    EKG   Radiology No results found.  Procedures Procedures (including critical care time)  Medications Ordered in UC Medications - No data to display  Initial Impression / Assessment and Plan / UC Course  I have reviewed the triage vital signs and the nursing notes.  Pertinent labs & imaging results that were available during my care of the patient were reviewed by me and considered in my medical decision making (see chart for details).   Patient is afebrile here while taking tylenol. Satting well on room air. Overall is ill appearing though non toxic. She is well hydrated, without respiratory distress. Pulmonary exam is unremarkable.   Likely viral pathology, possibly influenza.  Results from respiratory panel for COVID and flu are pending.  Recommended continued use of OTC medication for symptom control.  Final Clinical Impressions(s) / UC Diagnoses   Final diagnoses:  Flu-like symptoms   Discharge Instructions   None    ED Prescriptions   None    PDMP not reviewed this encounter.   Charma Igo, [place] 04/11/22 1637

## 2022-04-11 NOTE — ED Triage Notes (Signed)
Pt. Presents to UC w/ c/o a cough, nausea, chills and a headache for the past  4 days.

## 2022-04-11 NOTE — Discharge Instructions (Signed)
You have been diagnosed with a viral upper respiratory infection based on your symptoms and exam. Viral illnesses cannot be treated with antibiotics - they are self limiting - and you should find your symptoms resolving within a few days. Get plenty of rest and non-caffeinated fluids.  We have performed a respiratory swab checking for COVID, and influenza.  There will be no change in treatment plan if you have a positive result.  We recommend you use over-the-counter medications for symptom control including Tylenol or ibuprofen for fever, chills or body aches, and cold/cough medication.  Saline mist spray is helpful for removing excess mucus from your nose.  Room humidifiers are helpful to ease breathing at night. You might also find relief of nasal/sinus congestion symptoms by using a nasal decongestant such as Sudafed sinus (pseudoephedrine).  You will need to obtain this medication from behind the pharmacist counter.  Speak to the pharmacist to verify that you are not duplicating medications with other over-the-counter formulations that you may be using.   Follow up here or with your primary care provider if your symptoms are worsening or not improving.

## 2022-06-11 ENCOUNTER — Other Ambulatory Visit: Payer: Self-pay | Admitting: Nurse Practitioner

## 2022-06-11 DIAGNOSIS — E559 Vitamin D deficiency, unspecified: Secondary | ICD-10-CM

## 2022-06-24 ENCOUNTER — Ambulatory Visit (INDEPENDENT_AMBULATORY_CARE_PROVIDER_SITE_OTHER): Payer: BC Managed Care – PPO

## 2022-06-24 ENCOUNTER — Ambulatory Visit
Admission: EM | Admit: 2022-06-24 | Discharge: 2022-06-24 | Disposition: A | Discharge status: Home / Self Care | Payer: BC Managed Care – PPO | Attending: Urgent Care | Admitting: Urgent Care

## 2022-06-24 DIAGNOSIS — J45901 Unspecified asthma with (acute) exacerbation: Secondary | ICD-10-CM

## 2022-06-24 DIAGNOSIS — R0602 Shortness of breath: Secondary | ICD-10-CM

## 2022-06-24 DIAGNOSIS — R079 Chest pain, unspecified: Secondary | ICD-10-CM | POA: Diagnosis not present

## 2022-06-24 MED ORDER — PREDNISONE 20 MG PO TABS: 40.0000 mg | ORAL_TABLET | Freq: Every day | ORAL | 0 refills | Status: AC

## 2022-06-24 NOTE — Discharge Instructions (Signed)
Follow up with your primary care or pulmonary provider if your symptoms are worsening or not improving.

## 2022-06-24 NOTE — ED Provider Notes (Signed)
[facility]    CSN: [id number] Arrival date & time: 06/24/22  1408      History   Chief Complaint Chief Complaint  Patient presents with   Chest Pain    HPI Heather Harding is a 47 y.o. female.   HPI  Patient presents to urgent care with report of left-sided chest pain radiating to her left arm x 3 days.  She reports shortness of breath x 1 week.  She has been treating her symptoms with Tylenol and an inhaler.  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  Endorses history of asthma and pneumonia.  She was advised to come to urgent care by her pulmonary provider.  She is treated currently with Judithann Sauger and recently requested transition back to Symbicort because of elevated blood pressure.  She states her shortness of breath, symptoms of wheezing corresponds to [place] use.  She endorses increased level of stress related to the recent death of her grandfather.  She states at the onset of chest pain corresponds to this event.  Denies nausea and vomiting.  Denies dizziness.    Past Medical History:  Diagnosis Date   Asthma    BV (bacterial vaginosis)    GERD (gastroesophageal reflux disease)    Lung abnormality    "right lung underdeveloped"   Yeast infection     Patient Active Problem List   Diagnosis Date Noted   Shortness of breath 05/27/2021   ANEMIA, IRON DEFICIENCY, UNSPEC. 07/05/2006   ASTHMA, UNSPECIFIED 07/05/2006   GASTROESOPHAGEAL REFLUX, NO ESOPHAGITIS 07/05/2006    No past surgical history on file.  OB History     Gravida  1   Para  0   Term      Preterm      AB  1   Living  0      SAB      IAB  1   Ectopic      Multiple      Live Births  0            Home Medications    Prior to Admission medications   Medication Sig Start Date End Date Taking? Authorizing Provider  albuterol (VENTOLIN HFA) 108 (90 Base) MCG/ACT inhaler Inhale 1-2 puffs into the lungs every 6 (six) hours as needed for  wheezing or shortness of breath. 05/04/21   Hughie Closs, PA-C  benzonatate (TESSALON) 100 MG capsule Take 1 capsule (100 mg total) by mouth every 8 (eight) hours. 05/04/21   Hughie Closs, PA-C  BREZTRI AEROSPHERE 160-9-4.8 MCG/ACT AERO Inhale into the lungs. 02/04/21   [provider]  Cetirizine HCl (ZYRTEC ALLERGY) 10 MG CAPS Take 1 capsule (10 mg total) by mouth daily. 08/22/17   Tasia Catchings, Amy V, PA-C  Cyanocobalamin (VITAMIN B12 PO) Take 1 tablet by mouth daily at 12 noon.    [provider]  Ferrous Sulfate (IRON PO) Take by mouth. Take one tablet daily    [provider]  fluticasone (FLONASE) 50 MCG/ACT nasal spray Place 2 sprays into both nostrils daily. 05/04/21   Hughie Closs, PA-C  Magnesium 200 MG TABS TAKE 1 TABLET BY MOUTH ONCE DAILY WITH EVENING MEAL 08/09/21   Minette Brine, FNP  omeprazole (PRILOSEC) 10 MG capsule Take 10 mg by mouth as needed.    [provider]  Vitamin D, Ergocalciferol, (DRISDOL) 1.25 MG (50000 UNIT) CAPS capsule Take 1 capsule by mouth once a week 01/16/22   Laurance Flatten,  Doreene Burke, FNP    Family History Family History  Problem Relation Age of Onset   Cancer Paternal Grandfather    Stroke Maternal Grandmother    Stroke Maternal Grandfather    Diabetes Father    Hypertension Sister     Social History Social History   Tobacco Use   Smoking status: Never   Smokeless tobacco: Never  Vaping Use   Vaping Use: Never used  Substance Use Topics   Alcohol use: No   Drug use: No     Allergies   Aspirin, Morphine, Morphine and related, and Codeine   Review of Systems Review of Systems   Physical Exam Triage Vital Signs ED Triage Vitals  Enc Vitals Group     BP      Pulse      Resp      Temp      Temp src      SpO2      Weight      Height      Head Circumference      Peak Flow      Pain Score      Pain Loc      Pain Edu?      Excl. in [place]?    No data found.  Updated Vital Signs There were no  vitals taken for this visit.  Visual Acuity Right Eye Distance:   Left Eye Distance:   Bilateral Distance:    Right Eye Near:   Left Eye Near:    Bilateral Near:     Physical Exam   UC Treatments / Results  Labs (all labs ordered are listed, but only abnormal results are displayed) Labs Reviewed - No data to display  EKG   Radiology No results found.  Procedures Procedures (including critical care time)  Medications Ordered in UC Medications - No data to display  Initial Impression / Assessment and Plan / UC Course  I have reviewed the triage vital signs and the nursing notes.  Pertinent labs & imaging results that were available during my care of the patient were reviewed by me and considered in my medical decision making (see chart for details).   Patient is afebrile here without recent antipyretics. Satting well on room air. Overall is well appearing, well hydrated, without respiratory distress. Pulmonary exam is remarkable for expiratory wheezes.  ECG ordered for left-sided chest pain with shortness of breath, reassuring showing no acute changes, sinus arrhythmia. No significant changes compared with scan from 06/16/2019.  Chest xray ordered at request of pulmonology given hx of asthma. Reassuring with no acute cardiopulmonary process. Recommended treatment for acute exacerbation of asthma with prednisone which the patient states she has previously tolerated. She agrees with this treatment plan.  Final Clinical Impressions(s) / UC Diagnoses   Final diagnoses:  None   Discharge Instructions   None    ED Prescriptions   None    PDMP not reviewed this encounter.   Rose Phi, [place] 06/24/22 1514

## 2022-06-24 NOTE — ED Triage Notes (Signed)
Patient presents to UC for left sided chest pain that radiates to left arm since weds. SOB x 1 week.Treating pain with Tylenol, inhaler. 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 Hx of asthma and pneumonia. States she spoke with her pulmonology provider who instructed her to come in for eval.   Denies n/v, dizziness.

## 2022-07-08 ENCOUNTER — Other Ambulatory Visit: Payer: Self-pay | Admitting: Nurse Practitioner

## 2022-07-08 DIAGNOSIS — G43009 Migraine without aura, not intractable, without status migrainosus: Secondary | ICD-10-CM

## 2022-12-18 ENCOUNTER — Other Ambulatory Visit: Payer: Self-pay | Admitting: Nurse Practitioner

## 2022-12-18 DIAGNOSIS — E559 Vitamin D deficiency, unspecified: Secondary | ICD-10-CM

## 2023-01-04 ENCOUNTER — Encounter (HOSPITAL_COMMUNITY): Payer: Self-pay

## 2023-01-04 ENCOUNTER — Telehealth (HOSPITAL_COMMUNITY): Payer: Self-pay

## 2023-01-04 NOTE — Telephone Encounter (Signed)
Attempted to call patient in regards to Cardiac Rehab - LM on VM Mailed letter 

## 2023-01-09 ENCOUNTER — Telehealth (HOSPITAL_COMMUNITY): Payer: Self-pay

## 2023-01-09 NOTE — Telephone Encounter (Signed)
Returned pt phone call, LMTCB. °

## 2023-01-29 ENCOUNTER — Telehealth (HOSPITAL_COMMUNITY): Payer: Self-pay

## 2023-01-29 NOTE — Telephone Encounter (Signed)
No response from pt.  Closed referral

## 2023-03-27 ENCOUNTER — Ambulatory Visit
Admission: EM | Admit: 2023-03-27 | Discharge: 2023-03-27 | Disposition: A | Discharge status: Home / Self Care | Payer: BC Managed Care – PPO | Attending: Emergency Medicine | Admitting: Emergency Medicine

## 2023-03-27 DIAGNOSIS — R062 Wheezing: Secondary | ICD-10-CM

## 2023-03-27 DIAGNOSIS — J069 Acute upper respiratory infection, unspecified: Secondary | ICD-10-CM

## 2023-03-27 MED ORDER — PREDNISONE 10 MG (21) PO TBPK: ORAL_TABLET | Freq: Every day | ORAL | 0 refills | Status: DC

## 2023-03-27 MED ORDER — AZITHROMYCIN 250 MG PO TABS: 250.0000 mg | ORAL_TABLET | Freq: Every day | ORAL | 0 refills | Status: DC

## 2023-03-27 NOTE — Discharge Instructions (Signed)
Begin azithromycin to protect airway and prevent symptoms from progressing to more serious respiratory condition such as pneumonia  Begin prednisone every morning as directed to open and relax the airway, may attempt natural products like honey or turmeric to further help with coughing, may attempt any of the following suggestions below    You can take Tylenol and/or Ibuprofen as needed for fever reduction and pain relief.   For cough: honey 1/2 to 1 teaspoon (you can dilute the honey in water or another fluid).  You can also use guaifenesin and dextromethorphan for cough. You can use a humidifier for chest congestion and cough.  If you don't have a humidifier, you can sit in the bathroom with the hot shower running.      For sore throat: try warm salt water gargles, cepacol lozenges, throat spray, warm tea or water with lemon/honey, popsicles or ice, or OTC cold relief medicine for throat discomfort.   For congestion: take a daily anti-histamine like Zyrtec, Claritin, and a oral decongestant, such as pseudoephedrine.  You can also use Flonase 1-2 sprays in each nostril daily.   It is important to stay hydrated: drink plenty of fluids (water, gatorade/powerade/pedialyte, juices, or teas) to keep your throat moisturized and help further relieve irritation/discomfort.

## 2023-03-27 NOTE — ED Provider Notes (Signed)
[facility]    CSN: [id number] Arrival date & time: 03/27/23  1455      History   Chief Complaint No chief complaint on file.   HPI TANIKO REDDOCH is a 47 y.o. female.   Patient presents for evaluation of nasal congestion, chest congestion with yellow to green-tinged sputum, productive cough, sore throat, a fullness and sensation of the left ear being clogged, diarrhea and intermittent headaches present for 5 days.  Associated shortness of breath and wheezing.  Symptoms progressively worsening.  History of asthma and underdeveloped right lung, followed by pulmonology has albuterol inhaler available.  Has attempted use of inhaler, Zyrtec-D, Nexium which have been ineffective.  No known sick contacts prior.  Home COVID test -3 days ago.  Denies presence of fever.   Temperature 98.8, blood pressure 157/105, O2 saturation 97% on room air, heart rate 82, respirations 19 Past Medical History:  Diagnosis Date   Asthma    BV (bacterial vaginosis)    GERD (gastroesophageal reflux disease)    Lung abnormality    "right lung underdeveloped"   Yeast infection     Patient Active Problem List   Diagnosis Date Noted   Shortness of breath 05/27/2021   ANEMIA, IRON DEFICIENCY, UNSPEC. 07/05/2006   Asthma 07/05/2006   GASTROESOPHAGEAL REFLUX, NO ESOPHAGITIS 07/05/2006    No past surgical history on file.  OB History     Gravida  1   Para  0   Term      Preterm      AB  1   Living  0      SAB      IAB  1   Ectopic      Multiple      Live Births  0            Home Medications    Prior to Admission medications   Medication Sig Start Date End Date Taking? Authorizing Provider  azithromycin (ZITHROMAX) 250 MG tablet Take 1 tablet (250 mg total) by mouth daily. Take first 2 tablets together, then 1 every day until finished. 03/27/23  Yes Chantille Navarrete R, NP  predniSONE (STERAPRED UNI-PAK 21 TAB) 10 MG (21) TBPK tablet Take by mouth daily. Take 6  tabs by mouth daily  for 1 days, then 5 tabs for 1 days, then 4 tabs for 1 days, then 3 tabs for 1 days, 2 tabs for 1 days, then 1 tab by mouth daily for 1 days 03/27/23  Yes Misao Fackrell R, NP  albuterol (VENTOLIN HFA) 108 (90 Base) MCG/ACT inhaler Inhale 1-2 puffs into the lungs every 6 (six) hours as needed for wheezing or shortness of breath. 05/04/21   Rushie Chestnut, PA-C  benzonatate (TESSALON) 100 MG capsule Take 1 capsule (100 mg total) by mouth every 8 (eight) hours. 05/04/21   Rushie Chestnut, PA-C  BREZTRI AEROSPHERE 160-9-4.8 MCG/ACT AERO Inhale into the lungs. 02/04/21   [provider]  Cetirizine HCl (ZYRTEC ALLERGY) 10 MG CAPS Take 1 capsule (10 mg total) by mouth daily. 08/22/17   Cathie Hoops, Amy V, PA-C  Cyanocobalamin (VITAMIN B12 PO) Take 1 tablet by mouth daily at 12 noon.    [provider]  Ferrous Sulfate (IRON PO) Take by mouth. Take one tablet daily    [provider]  fluticasone (FLONASE) 50 MCG/ACT nasal spray Place 2 sprays into both nostrils daily. 05/04/21   Rushie Chestnut, PA-C  Magnesium 200 MG TABS TAKE 1 TABLET  BY MOUTH ONCE DAILY WITH EVENING MEAL 08/09/21   Arnette Felts, FNP  omeprazole (PRILOSEC) 10 MG capsule Take 10 mg by mouth as needed.    [provider]  Vitamin D, Ergocalciferol, (DRISDOL) 1.25 MG (50000 UNIT) CAPS capsule Take 1 capsule by mouth once a week 01/16/22   Arnette Felts, FNP    Family History Family History  Problem Relation Age of Onset   Cancer Paternal Grandfather    Stroke Maternal Grandmother    Stroke Maternal Grandfather    Diabetes Father    Hypertension Sister     Social History Social History   Tobacco Use   Smoking status: Never   Smokeless tobacco: Never  Vaping Use   Vaping status: Never Used  Substance Use Topics   Alcohol use: No   Drug use: No     Allergies   Aspirin, Morphine, Morphine and codeine, and Codeine   Review of Systems Review of Systems   Physical  Exam Triage Vital Signs ED Triage Vitals  Encounter Vitals Group     BP      Systolic BP Percentile      Diastolic BP Percentile      Pulse      Resp      Temp      Temp src      SpO2      Weight      Height      Head Circumference      Peak Flow      Pain Score      Pain Loc      Pain Education      Exclude from Growth Chart    No data found.  Updated Vital Signs There were no vitals taken for this visit.  Visual Acuity Right Eye Distance:   Left Eye Distance:   Bilateral Distance:    Right Eye Near:   Left Eye Near:    Bilateral Near:     Physical Exam Constitutional:      Appearance: Normal appearance.  HENT:     Head: Normocephalic.     Right Ear: Tympanic membrane, ear canal and external ear normal.     Left Ear: Tympanic membrane, ear canal and external ear normal.     Nose: Congestion present. No rhinorrhea.     Mouth/Throat:     Mouth: Mucous membranes are moist.     Pharynx: Oropharynx is clear.  Eyes:     Extraocular Movements: Extraocular movements intact.  Cardiovascular:     Rate and Rhythm: Normal rate and regular rhythm.     Pulses: Normal pulses.     Heart sounds: Normal heart sounds.  Pulmonary:     Effort: Pulmonary effort is normal.     Breath sounds: Normal breath sounds.  Musculoskeletal:     Cervical back: Normal range of motion and neck supple.  Skin:    General: Skin is warm and dry.  Neurological:     Mental Status: She is alert and oriented to person, place, and time. Mental status is at baseline.      UC Treatments / Results  Labs (all labs ordered are listed, but only abnormal results are displayed) Labs Reviewed - No data to display  EKG   Radiology No results found.  Procedures Procedures (including critical care time)  Medications Ordered in UC Medications - No data to display  Initial Impression / Assessment and Plan / UC Course  I have reviewed the triage vital  signs and the nursing notes.  Pertinent  labs & imaging results that were available during my care of the patient were reviewed by me and considered in my medical decision making (see chart for details).  Acute URI, wheezing  Wheezing and congestion noted on exam, patient in no signs of distress nontoxic-appearing, stable for outpatient management, prescribed azithromycin and prednisone, declined prescription for cough medicine, recommended continued use of inhalers and over-the-counter medications for additional support with follow-up with urgent care or her pulmonologist for reevaluation as needed Final Clinical Impressions(s) / UC Diagnoses   Final diagnoses:  Acute URI  Wheezing     Discharge Instructions      Begin azithromycin to protect airway and prevent symptoms from progressing to more serious respiratory condition such as pneumonia  Begin prednisone every morning as directed to open and relax the airway, may attempt natural products like honey or turmeric to further help with coughing, may attempt any of the following suggestions below    You can take Tylenol and/or Ibuprofen as needed for fever reduction and pain relief.   For cough: honey 1/2 to 1 teaspoon (you can dilute the honey in water or another fluid).  You can also use guaifenesin and dextromethorphan for cough. You can use a humidifier for chest congestion and cough.  If you don't have a humidifier, you can sit in the bathroom with the hot shower running.      For sore throat: try warm salt water gargles, cepacol lozenges, throat spray, warm tea or water with lemon/honey, popsicles or ice, or OTC cold relief medicine for throat discomfort.   For congestion: take a daily anti-histamine like Zyrtec, Claritin, and a oral decongestant, such as pseudoephedrine.  You can also use Flonase 1-2 sprays in each nostril daily.   It is important to stay hydrated: drink plenty of fluids (water, gatorade/powerade/pedialyte, juices, or teas) to keep your throat moisturized  and help further relieve irritation/discomfort.    ED Prescriptions     Medication Sig Dispense Auth. Provider   predniSONE (STERAPRED UNI-PAK 21 TAB) 10 MG (21) TBPK tablet Take by mouth daily. Take 6 tabs by mouth daily  for 1 days, then 5 tabs for 1 days, then 4 tabs for 1 days, then 3 tabs for 1 days, 2 tabs for 1 days, then 1 tab by mouth daily for 1 days 21 tablet Caniya Tagle R, NP   azithromycin (ZITHROMAX) 250 MG tablet Take 1 tablet (250 mg total) by mouth daily. Take first 2 tablets together, then 1 every day until finished. 6 tablet Valinda Hoar, NP      PDMP not reviewed this encounter.   Valinda Hoar, NP 03/27/23 [phone]

## 2023-03-27 NOTE — ED Triage Notes (Signed)
Provider triage  

## 2023-03-29 ENCOUNTER — Telehealth: Payer: Self-pay

## 2023-06-03 ENCOUNTER — Emergency Department (HOSPITAL_COMMUNITY): Payer: 59

## 2023-06-03 ENCOUNTER — Encounter (HOSPITAL_COMMUNITY): Payer: Self-pay

## 2023-06-03 ENCOUNTER — Inpatient Hospital Stay (HOSPITAL_COMMUNITY)
Admission: EM | Admit: 2023-06-03 | Discharge: 2023-06-07 | DRG: 871 | Disposition: A | Discharge status: Home / Self Care | Payer: 59 | Attending: Family Medicine | Admitting: Family Medicine

## 2023-06-03 ENCOUNTER — Other Ambulatory Visit: Payer: Self-pay

## 2023-06-03 DIAGNOSIS — A419 Sepsis, unspecified organism: Principal | ICD-10-CM | POA: Diagnosis present

## 2023-06-03 DIAGNOSIS — Z8249 Family history of ischemic heart disease and other diseases of the circulatory system: Secondary | ICD-10-CM

## 2023-06-03 DIAGNOSIS — Z6841 Body Mass Index (BMI) 40.0 and over, adult: Secondary | ICD-10-CM | POA: Diagnosis not present

## 2023-06-03 DIAGNOSIS — J9621 Acute and chronic respiratory failure with hypoxia: Secondary | ICD-10-CM | POA: Diagnosis present

## 2023-06-03 DIAGNOSIS — Z833 Family history of diabetes mellitus: Secondary | ICD-10-CM | POA: Diagnosis not present

## 2023-06-03 DIAGNOSIS — J4551 Severe persistent asthma with (acute) exacerbation: Secondary | ICD-10-CM | POA: Diagnosis present

## 2023-06-03 DIAGNOSIS — D638 Anemia in other chronic diseases classified elsewhere: Secondary | ICD-10-CM | POA: Diagnosis present

## 2023-06-03 DIAGNOSIS — Z79899 Other long term (current) drug therapy: Secondary | ICD-10-CM

## 2023-06-03 DIAGNOSIS — Z823 Family history of stroke: Secondary | ICD-10-CM | POA: Diagnosis not present

## 2023-06-03 DIAGNOSIS — Z1152 Encounter for screening for COVID-19: Secondary | ICD-10-CM

## 2023-06-03 DIAGNOSIS — Q336 Congenital hypoplasia and dysplasia of lung: Secondary | ICD-10-CM

## 2023-06-03 DIAGNOSIS — Q2579 Other congenital malformations of pulmonary artery: Secondary | ICD-10-CM

## 2023-06-03 DIAGNOSIS — J101 Influenza due to other identified influenza virus with other respiratory manifestations: Secondary | ICD-10-CM | POA: Diagnosis present

## 2023-06-03 DIAGNOSIS — E66813 Obesity, class 3: Secondary | ICD-10-CM | POA: Diagnosis present

## 2023-06-03 DIAGNOSIS — Z886 Allergy status to analgesic agent status: Secondary | ICD-10-CM | POA: Diagnosis not present

## 2023-06-03 DIAGNOSIS — R6520 Severe sepsis without septic shock: Secondary | ICD-10-CM | POA: Diagnosis present

## 2023-06-03 DIAGNOSIS — K219 Gastro-esophageal reflux disease without esophagitis: Secondary | ICD-10-CM | POA: Diagnosis present

## 2023-06-03 DIAGNOSIS — Z7951 Long term (current) use of inhaled steroids: Secondary | ICD-10-CM

## 2023-06-03 DIAGNOSIS — J45909 Unspecified asthma, uncomplicated: Secondary | ICD-10-CM | POA: Diagnosis present

## 2023-06-03 DIAGNOSIS — Z9109 Other allergy status, other than to drugs and biological substances: Secondary | ICD-10-CM

## 2023-06-03 DIAGNOSIS — J9601 Acute respiratory failure with hypoxia: Secondary | ICD-10-CM | POA: Diagnosis present

## 2023-06-03 DIAGNOSIS — D509 Iron deficiency anemia, unspecified: Secondary | ICD-10-CM | POA: Diagnosis present

## 2023-06-03 DIAGNOSIS — Z885 Allergy status to narcotic agent status: Secondary | ICD-10-CM | POA: Diagnosis not present

## 2023-06-03 LAB — COMPREHENSIVE METABOLIC PANEL
ALT: 26 U/L (ref 0–44)
AST: 37 U/L (ref 15–41)
Albumin: 3.7 g/dL (ref 3.5–5.0)
Alkaline Phosphatase: 70 U/L (ref 38–126)
Anion gap: 12 (ref 5–15)
BUN: 5 mg/dL — ABNORMAL LOW (ref 6–20)
CO2: 25 mmol/L (ref 22–32)
Calcium: 9.4 mg/dL (ref 8.9–10.3)
Chloride: 103 mmol/L (ref 98–111)
Creatinine, Ser: 1 mg/dL (ref 0.44–1.00)
GFR, Estimated: >60 mL/min (ref >60)
Glucose, Bld: 129 mg/dL — ABNORMAL HIGH (ref 70–99)
Potassium: 4.4 mmol/L (ref 3.5–5.1)
Sodium: 140 mmol/L (ref 135–145)
Total Bilirubin: 0.6 mg/dL (ref 0.0–1.2)
Total Protein: 7.9 g/dL (ref 6.5–8.1)

## 2023-06-03 LAB — CBC WITH DIFFERENTIAL/PLATELET
Abs Immature Granulocytes: 0.03 10*3/uL (ref 0.00–0.07)
Basophils Absolute: 0 10*3/uL (ref 0.0–0.1)
Basophils Relative: 0 %
Eosinophils Absolute: 0 10*3/uL (ref 0.0–0.5)
Eosinophils Relative: 0 %
HCT: 40 % (ref 36.0–46.0)
Hemoglobin: 12.4 g/dL (ref 12.0–15.0)
Immature Granulocytes: 0 %
Lymphocytes Relative: 8 %
Lymphs Abs: 0.8 10*3/uL (ref 0.7–4.0)
MCH: 24 pg — ABNORMAL LOW (ref 26.0–34.0)
MCHC: 31 g/dL (ref 30.0–36.0)
MCV: 77.5 fL — ABNORMAL LOW (ref 80.0–100.0)
Monocytes Absolute: 0.5 10*3/uL (ref 0.1–1.0)
Monocytes Relative: 5 %
Neutro Abs: 8.1 10*3/uL — ABNORMAL HIGH (ref 1.7–7.7)
Neutrophils Relative %: 87 %
Platelets: 284 10*3/uL (ref 150–400)
RBC: 5.16 MIL/uL — ABNORMAL HIGH (ref 3.87–5.11)
RDW: 18.1 % — ABNORMAL HIGH (ref 11.5–15.5)
WBC: 9.4 10*3/uL (ref 4.0–10.5)
nRBC: 0 % (ref 0.0–0.2)

## 2023-06-03 LAB — RESP PANEL BY RT-PCR (RSV, FLU A&B, COVID)  RVPGX2
Influenza A by PCR: POSITIVE — AB
Influenza B by PCR: NEGATIVE
Resp Syncytial Virus by PCR: NEGATIVE
SARS Coronavirus 2 by RT PCR: NEGATIVE

## 2023-06-03 MED ORDER — IPRATROPIUM-ALBUTEROL 0.5-2.5 (3) MG/3ML IN SOLN
3.0000 mL | Freq: Four times a day (QID) | RESPIRATORY_TRACT | Status: DC
  Administered 2023-06-03 – 2023-06-05 (×7): 3 mL via RESPIRATORY_TRACT
  Filled 2023-06-03 (×7): qty 3

## 2023-06-03 MED ORDER — ACETAMINOPHEN 325 MG PO TABS: 650.0000 mg | ORAL_TABLET | Freq: Four times a day (QID) | ORAL | Status: DC | PRN

## 2023-06-03 MED ORDER — LACTATED RINGERS IV BOLUS
1000.0000 mL | Freq: Once | INTRAVENOUS | Status: AC
  Administered 2023-06-03: 1000 mL via INTRAVENOUS

## 2023-06-03 MED ORDER — ONDANSETRON HCL 4 MG/2ML IJ SOLN: 4.0000 mg | Freq: Once | INTRAMUSCULAR | Status: DC

## 2023-06-03 MED ORDER — ONDANSETRON HCL 4 MG/2ML IJ SOLN: 4.0000 mg | Freq: Four times a day (QID) | INTRAMUSCULAR | Status: DC | PRN

## 2023-06-03 MED ORDER — METHYLPREDNISOLONE SODIUM SUCC 125 MG IJ SOLR
125.0000 mg | Freq: Two times a day (BID) | INTRAMUSCULAR | Status: DC
  Administered 2023-06-03 – 2023-06-05 (×4): 125 mg via INTRAVENOUS
  Filled 2023-06-03 (×4): qty 2

## 2023-06-03 MED ORDER — OSELTAMIVIR PHOSPHATE 75 MG PO CAPS
75.0000 mg | ORAL_CAPSULE | Freq: Two times a day (BID) | ORAL | Status: DC
  Administered 2023-06-04 – 2023-06-05 (×3): 75 mg via ORAL
  Filled 2023-06-03 (×7): qty 1

## 2023-06-03 MED ORDER — MAGNESIUM SULFATE 2 GM/50ML IV SOLN
2.0000 g | Freq: Once | INTRAVENOUS | Status: AC
  Administered 2023-06-03: 2 g via INTRAVENOUS
  Filled 2023-06-03: qty 50

## 2023-06-03 MED ORDER — IPRATROPIUM-ALBUTEROL 0.5-2.5 (3) MG/3ML IN SOLN
3.0000 mL | RESPIRATORY_TRACT | Status: DC | PRN
  Administered 2023-06-04 – 2023-06-06 (×4): 3 mL via RESPIRATORY_TRACT
  Filled 2023-06-03 (×4): qty 3

## 2023-06-03 MED ORDER — ALBUTEROL SULFATE (2.5 MG/3ML) 0.083% IN NEBU
15.0000 mg/h | INHALATION_SOLUTION | Freq: Once | RESPIRATORY_TRACT | Status: AC
  Administered 2023-06-03: 15 mg/h via RESPIRATORY_TRACT
  Filled 2023-06-03: qty 36

## 2023-06-03 MED ORDER — ACETAMINOPHEN 650 MG RE SUPP: 650.0000 mg | Freq: Four times a day (QID) | RECTAL | Status: DC | PRN

## 2023-06-03 MED ORDER — ONDANSETRON HCL 4 MG PO TABS: 4.0000 mg | ORAL_TABLET | Freq: Four times a day (QID) | ORAL | Status: DC | PRN

## 2023-06-03 MED ORDER — LACTATED RINGERS IV SOLN: INTRAVENOUS | Status: AC

## 2023-06-03 MED ORDER — ENOXAPARIN SODIUM 40 MG/0.4ML IJ SOSY
40.0000 mg | PREFILLED_SYRINGE | INTRAMUSCULAR | Status: DC
  Administered 2023-06-04 – 2023-06-07 (×4): 40 mg via SUBCUTANEOUS
  Filled 2023-06-03 (×4): qty 0.4

## 2023-06-03 MED ORDER — ACETAMINOPHEN 10 MG/ML IV SOLN
1000.0000 mg | Freq: Four times a day (QID) | INTRAVENOUS | Status: AC | PRN
  Administered 2023-06-04: 1000 mg via INTRAVENOUS
  Filled 2023-06-03: qty 100

## 2023-06-03 MED ORDER — OSELTAMIVIR PHOSPHATE 75 MG PO CAPS: 75.0000 mg | ORAL_CAPSULE | Freq: Once | ORAL | Status: DC

## 2023-06-03 NOTE — ED Triage Notes (Signed)
Pt BIB [facility] from home C/O respiratory distress. Pt reports SOB since yesterday. Arrives in tripod position, with audible wheezing and accessory muscle use. MD and RT called to room to evaluate patient.   EMS gave 5 mg albuterol, 1 duoneb.  Arrives on neb tx.

## 2023-06-03 NOTE — H&P (Signed)
History and Physical    Patient: Heather Harding UJW:[id number] DOB: 1976/01/24 DOA: 06/03/2023 DOS: the patient was seen and examined on 06/03/2023 PCP: Joylene Igo, MD  Patient coming from: Home  Chief Complaint:  Chief Complaint  Patient presents with   Respiratory Distress   HPI: Heather Harding is a 48 y.o. female with medical history significant of chronic persistent asthma, GERD, hypoplastic lung, congenital hypoplastic pulmonary artery who was exposed to the flu and came to the ER with significant shortness of breath and cough.  Patient was brought in with oxygen sats in the 60s.  She started having shortness of breath yesterday.  She tried her inhalers and breathing treatments at home.  Symptoms continue to escalate and get worse.  EMS were called where she get 5 mg of albuterol as well as continuous breathing treatment in the ER but still very hypoxic.  Oxygen sats went into the 70s initially.  Patient is tachypneic with heart rates in the 120s.  She also has a temperature 101 meeting technically speaking sepsis criteria.  Patient finally agreed to have BiPAP and she has stabilized on BiPAP still tachypneic.  She has been admitted with acute hypoxic respiratory failure most likely secondary to influenza infection.  Chest x-ray showed no secondary bacterial  pneumonia.  Review of Systems: As mentioned in the history of present illness. All other systems reviewed and are negative. Past Medical History:  Diagnosis Date   Asthma    BV (bacterial vaginosis)    GERD (gastroesophageal reflux disease)    Lung abnormality    "right lung underdeveloped"   Yeast infection    History reviewed. No pertinent surgical history. Social History:  reports that she has never smoked. She has never used smokeless tobacco. She reports that she does not drink alcohol and does not use drugs.  Allergies  Allergen Reactions   Aspirin Other (See Comments)    Other Reaction: RYE   Morphine Other  (See Comments)    Family hx of allergy   Morphine And Codeine    Codeine Rash    Family History  Problem Relation Age of Onset   Cancer Paternal Grandfather    Stroke Maternal Grandmother    Stroke Maternal Grandfather    Diabetes Father    Hypertension Sister     Prior to Admission medications   Medication Sig Start Date End Date Taking? Authorizing Provider  albuterol (VENTOLIN HFA) 108 (90 Base) MCG/ACT inhaler Inhale 1-2 puffs into the lungs every 6 (six) hours as needed for wheezing or shortness of breath. 05/04/21   Rushie Chestnut, PA-C  azithromycin (ZITHROMAX) 250 MG tablet Take 1 tablet (250 mg total) by mouth daily. Take first 2 tablets together, then 1 every day until finished. 03/27/23   White, Elita Boone, NP  benzonatate (TESSALON) 100 MG capsule Take 1 capsule (100 mg total) by mouth every 8 (eight) hours. 05/04/21   Rushie Chestnut, PA-C  BREZTRI AEROSPHERE 160-9-4.8 MCG/ACT AERO Inhale into the lungs. 02/04/21   [provider]  Cetirizine HCl (ZYRTEC ALLERGY) 10 MG CAPS Take 1 capsule (10 mg total) by mouth daily. 08/22/17   Cathie Hoops, Amy V, PA-C  Cyanocobalamin (VITAMIN B12 PO) Take 1 tablet by mouth daily at 12 noon.    [provider]  Ferrous Sulfate (IRON PO) Take by mouth. Take one tablet daily    [provider]  fluticasone (FLONASE) 50 MCG/ACT nasal spray Place 2 sprays into both nostrils daily. 05/04/21  Clent Jacks M, PA-C  Magnesium 200 MG TABS TAKE 1 TABLET BY MOUTH ONCE DAILY WITH EVENING MEAL 08/09/21   Arnette Felts, FNP  omeprazole (PRILOSEC) 10 MG capsule Take 10 mg by mouth as needed.    [provider]  predniSONE (STERAPRED UNI-PAK 21 TAB) 10 MG (21) TBPK tablet Take by mouth daily. Take 6 tabs by mouth daily  for 1 days, then 5 tabs for 1 days, then 4 tabs for 1 days, then 3 tabs for 1 days, 2 tabs for 1 days, then 1 tab by mouth daily for 1 days 03/27/23   Valinda Hoar, NP  Vitamin D, Ergocalciferol,  (DRISDOL) 1.25 MG (50000 UNIT) CAPS capsule Take 1 capsule by mouth once a week 01/16/22   Arnette Felts, FNP    Physical Exam: Vitals:   06/03/23 1815 06/03/23 1830 06/03/23 1845 06/03/23 1853  BP: (!) 182/93  (!) 183/111   Pulse:  (!) 157 (!) 152   Resp: (!) 34 (!) 29 (!) 33   Temp:    100.3 F (37.9 C)  TempSrc:    Axillary  SpO2:  100% 100%   Weight:      Height:       Constitutional: Acutely ill looking with respiratory distress,  Eyes: PERRL, lids and conjunctivae normal ENMT: Mucous membranes are moist. Posterior pharynx clear of any exudate or lesions.Normal dentition.  Neck: normal, supple, no masses, no thyromegaly Respiratory: Decreased air entry bilaterally with marked expiratory wheezing, no crackles no rales with accessory muscle use. Cardiovascular: Regular rate and rhythm, no murmurs / rubs / gallops. No extremity edema. 2+ pedal pulses. No carotid bruits.  Abdomen: no tenderness, no masses palpated. No hepatosplenomegaly. Bowel sounds positive.  Musculoskeletal: Good range of motion, no joint swelling or tenderness, Skin: no rashes, lesions, ulcers. No induration Neurologic: CN 2-12 grossly intact. Sensation intact, DTR normal. Strength 5/5 in all 4.  Psychiatric: Normal judgment and insight. Alert and oriented x 3. Normal mood  Data Reviewed:  Temperature 101, blood pressure 183/111, pulse 166, respiratory rate of 60 oxygen sat 60% on room air currently 100% on BiPAP white count is 9.4 BUN 5 glucose 129, influenza A is positive chest x-ray showed chronic right with mediastinal shift due to right lung congenital hypoplasia otherwise no acute findings.  EKG shows sinus tachycardia  Assessment and Plan:  #1 acute hypoxic respiratory failure: Secondary to acute asthmatic attack with exacerbations as a result of influenza A infection.  Patient will be admitted.  IV steroid, nebulizer treatments.  She is currently on BiPAP.  Continue treatment and titrate off BiPAP.  #2  influenza A infection: Initiate Tamiflu.  Hydrate and monitor  #3 GERD: Continue PPIs.  #4 anemia of chronic disease: Continue to monitor H&H  #5 morbid obesity: Dietary counseling  #6 right lung hypoplasia: Genetic, congenital.  Patient is stable.    Advance Care Planning:   Code Status: Full Code   Consults: None  Family Communication: No family at bedside  Severity of Illness: The appropriate patient status for this patient is INPATIENT. Inpatient status is judged to be reasonable and necessary in order to provide the required intensity of service to ensure the patient's safety. The patient's presenting symptoms, physical exam findings, and initial radiographic and laboratory data in the context of their chronic comorbidities is felt to place them at high risk for further clinical deterioration. Furthermore, it is not anticipated that the patient will be medically stable for discharge from the hospital within  2 midnights of admission.   * I certify that at the point of admission it is my clinical judgment that the patient will require inpatient hospital care spanning beyond 2 midnights from the point of admission due to high intensity of service, high risk for further deterioration and high frequency of surveillance required.*  AuthorLonia Blood, MD 06/03/2023 7:57 PM  For on call review [email or web address].

## 2023-06-03 NOTE — ED Notes (Signed)
Pt on BiPAP tolerating well at this time.

## 2023-06-03 NOTE — ED Provider Notes (Signed)
Heather Harding Provider Note   CSN: [id number] Arrival date & time: 06/03/23  1635     History Chief Complaint  Patient presents with   Respiratory Distress    HPI Heather Harding is a 48 y.o. female presenting for fever/cough/SOB.   Patient's recorded medical, surgical, social, medication list and allergies were reviewed in the Snapshot window as part of the initial history.   Review of Systems   Review of Systems  Constitutional:  Positive for fever. Negative for chills.  HENT:  Positive for congestion and sore throat. Negative for ear pain.   Eyes:  Negative for pain and visual disturbance.  Respiratory:  Positive for cough. Negative for choking and shortness of breath.   Cardiovascular:  Negative for chest pain and palpitations.  Gastrointestinal:  Negative for abdominal pain and vomiting.  Genitourinary:  Negative for dysuria and hematuria.  Musculoskeletal:  Negative for arthralgias and back pain.  Skin:  Negative for color change and rash.  Neurological:  Negative for seizures and syncope.  All other systems reviewed and are negative.   Physical Exam Updated Vital Signs BP (!) 163/100   Pulse (!) 131   Temp (!) 103.1 F (39.5 C) (Oral)   Resp (!) 32   Ht 5\' 6"  (1.676 m)   Wt 113.4 kg   SpO2 100%   BMI 40.35 kg/m  Physical Exam Vitals and nursing note reviewed.  Constitutional:      General: She is not in acute distress.    Appearance: She is well-developed.  HENT:     Head: Normocephalic and atraumatic.  Eyes:     Conjunctiva/sclera: Conjunctivae normal.  Cardiovascular:     Rate and Rhythm: Normal rate and regular rhythm.     Heart sounds: No murmur heard. Pulmonary:     Effort: Pulmonary effort is normal. No respiratory distress.     Breath sounds: Normal breath sounds.  Abdominal:     General: There is no distension.     Palpations: Abdomen is soft.     Tenderness: There is no abdominal tenderness.  There is no right CVA tenderness or left CVA tenderness.  Musculoskeletal:        General: No swelling or tenderness. Normal range of motion.     Cervical back: Neck supple.  Skin:    General: Skin is warm and dry.  Neurological:     General: No focal deficit present.     Mental Status: She is alert and oriented to person, place, and time. Mental status is at baseline.     Cranial Nerves: No cranial nerve deficit.      ED Course/ Medical Decision Making/ A&P    Procedures .Critical Care  Performed by: Glyn Ade, MD Authorized by: Glyn Ade, MD   Critical care provider statement:    Critical care time (minutes):  95   Critical care was necessary to treat or prevent imminent or life-threatening deterioration of the following conditions:  Respiratory failure   Critical care was time spent personally by me on the following activities:  Development of treatment plan with patient or surrogate, discussions with consultants, evaluation of patient's response to treatment, examination of patient, ordering and review of laboratory studies, ordering and review of radiographic studies, ordering and performing treatments and interventions, pulse oximetry, re-evaluation of patient's condition and review of old charts    Medications Ordered in ED Medications  ondansetron (ZOFRAN) injection 4 mg (4 mg Intravenous Patient Refused/Not Given  06/03/23 1936)  enoxaparin (LOVENOX) injection 40 mg (has no administration in time range)  lactated ringers infusion ( Intravenous New Bag/Given 06/03/23 2244)  acetaminophen (TYLENOL) tablet 650 mg (has no administration in time range)    Or  acetaminophen (TYLENOL) suppository 650 mg (has no administration in time range)  ondansetron (ZOFRAN) tablet 4 mg (has no administration in time range)    Or  ondansetron (ZOFRAN) injection 4 mg (has no administration in time range)  oseltamivir (TAMIFLU) capsule 75 mg (75 mg Oral Not Given 06/03/23 2132)   methylPREDNISolone sodium succinate (SOLU-MEDROL) 125 mg/2 mL injection 125 mg (0 mg Intravenous Hold 06/03/23 2244)  ipratropium-albuterol (DUONEB) 0.5-2.5 (3) MG/3ML nebulizer solution 3 mL (3 mLs Nebulization Given 06/03/23 2147)  ipratropium-albuterol (DUONEB) 0.5-2.5 (3) MG/3ML nebulizer solution 3 mL (has no administration in time range)  albuterol (PROVENTIL) (2.5 MG/3ML) 0.083% nebulizer solution (15 mg/hr Nebulization Given 06/03/23 1709)  magnesium sulfate IVPB 2 g 50 mL (0 g Intravenous Stopped 06/03/23 1846)  lactated ringers bolus 1,000 mL (0 mLs Intravenous Stopped 06/03/23 2048)    Medical Decision Making:   Severe acute asthma exacerbation I was called immediately to patient's bedside as she was satting in the low 40% initially on arrival.  Part of this is poor [place] but patient does appear to have a severe restrictive presentation. Patient very resistant to trialing BiPAP due to history of poor performance on this.  She was exposed to flu recently that may be the etiology of her acute worsening over the past 48 hours.  After 2 hours on continuous nebulizer therapy her oxygen saturation was in the mid 80s and patient finally consented to progressing to BiPAP.  She had gross improvement on BiPAP within an hour. All aggressive therapies were pursued for obstructive disease including magnesium bronchodilator, inhaled bronchodilators, steroids.  Considered epinephrine but she was already grossly tachycardic. Given improvement with BiPAP I believe patient is stable for admission to the stepdown unit for close observation overnight tonight.  Consulted hospitalist who is in agreement.  Offered Tamiflu treatment.  No evidence of consolidative process at this time.  Disposition:   Based on the above findings, I believe this patient is stable for admission.    Patient/family educated about specific findings on our evaluation and explained exact reasons for admission.  Patient/family educated  about clinical situation and time was allowed to answer questions.   Admission team communicated with and agreed with need for admission. Patient admitted. Patient ready to move at this time.     Emergency Department Medication Summary:   Medications  ondansetron [facility] [place]) injection 4 mg (4 mg Intravenous Patient Refused/Not Given 06/03/23 1936)  enoxaparin (LOVENOX) injection 40 mg (has no administration in time range)  lactated ringers infusion ( Intravenous New Bag/Given 06/03/23 2244)  acetaminophen (TYLENOL) tablet 650 mg (has no administration in time range)    Or  acetaminophen (TYLENOL) suppository 650 mg (has no administration in time range)  ondansetron (ZOFRAN) tablet 4 mg (has no administration in time range)    Or  ondansetron (ZOFRAN) injection 4 mg (has no administration in time range)  oseltamivir (TAMIFLU) capsule 75 mg (75 mg Oral Not Given 06/03/23 2132)  methylPREDNISolone sodium succinate (SOLU-MEDROL) 125 mg/2 mL injection 125 mg (0 mg Intravenous Hold 06/03/23 2244)  ipratropium-albuterol (DUONEB) 0.5-2.5 (3) MG/3ML nebulizer solution 3 mL (3 mLs Nebulization Given 06/03/23 2147)  ipratropium-albuterol (DUONEB) 0.5-2.5 (3) MG/3ML nebulizer solution 3 mL (has no administration in time range)  albuterol (PROVENTIL) (2.5  MG/3ML) 0.083% nebulizer solution (15 mg/hr Nebulization Given 06/03/23 1709)  magnesium sulfate IVPB 2 g 50 mL (0 g Intravenous Stopped 06/03/23 1846)  lactated ringers bolus 1,000 mL (0 mLs Intravenous Stopped 06/03/23 2048)     Clinical Impression:  1. Acute respiratory failure with hypoxia (HCC)      Admit   Final Clinical Impression(s) / ED Diagnoses Final diagnoses:  Acute respiratory failure with hypoxia [facility])    Rx / DC Orders ED Discharge Orders     None         Glyn Ade, MD 06/03/23 2300

## 2023-06-04 DIAGNOSIS — R6520 Severe sepsis without septic shock: Secondary | ICD-10-CM | POA: Insufficient documentation

## 2023-06-04 DIAGNOSIS — J9621 Acute and chronic respiratory failure with hypoxia: Secondary | ICD-10-CM | POA: Diagnosis not present

## 2023-06-04 LAB — MRSA NEXT GEN BY PCR, NASAL: MRSA by PCR Next Gen: NOT DETECTED

## 2023-06-04 LAB — COMPREHENSIVE METABOLIC PANEL
ALT: 22 U/L (ref 0–44)
AST: 29 U/L (ref 15–41)
Albumin: 3.5 g/dL (ref 3.5–5.0)
Alkaline Phosphatase: 65 U/L (ref 38–126)
Anion gap: 13 (ref 5–15)
BUN: 6 mg/dL (ref 6–20)
CO2: 21 mmol/L — ABNORMAL LOW (ref 22–32)
Calcium: 8.7 mg/dL — ABNORMAL LOW (ref 8.9–10.3)
Chloride: 102 mmol/L (ref 98–111)
Creatinine, Ser: 1.03 mg/dL — ABNORMAL HIGH (ref 0.44–1.00)
GFR, Estimated: >60 mL/min (ref >60)
Glucose, Bld: 152 mg/dL — ABNORMAL HIGH (ref 70–99)
Potassium: 4.2 mmol/L (ref 3.5–5.1)
Sodium: 136 mmol/L (ref 135–145)
Total Bilirubin: 0.5 mg/dL (ref 0.0–1.2)
Total Protein: 7.6 g/dL (ref 6.5–8.1)

## 2023-06-04 LAB — CBC
HCT: 38.9 % (ref 36.0–46.0)
Hemoglobin: 12.1 g/dL (ref 12.0–15.0)
MCH: 24.3 pg — ABNORMAL LOW (ref 26.0–34.0)
MCHC: 31.1 g/dL (ref 30.0–36.0)
MCV: 78.1 fL — ABNORMAL LOW (ref 80.0–100.0)
Platelets: 259 10*3/uL (ref 150–400)
RBC: 4.98 MIL/uL (ref 3.87–5.11)
RDW: 18.5 % — ABNORMAL HIGH (ref 11.5–15.5)
WBC: 8.2 10*3/uL (ref 4.0–10.5)
nRBC: 0 % (ref 0.0–0.2)

## 2023-06-04 LAB — HIV ANTIBODY (ROUTINE TESTING W REFLEX): HIV Screen 4th Generation wRfx: NONREACTIVE

## 2023-06-04 MED ORDER — SODIUM CHLORIDE 0.9 % IV SOLN
500.0000 mg | Freq: Every day | INTRAVENOUS | Status: DC
  Administered 2023-06-04 (×2): 500 mg via INTRAVENOUS
  Filled 2023-06-04 (×2): qty 5

## 2023-06-04 NOTE — ED Notes (Signed)
Provided pt recliner for mom and fan

## 2023-06-04 NOTE — ED Notes (Signed)
Pt requested a semicort inhaler. Rn will message provider

## 2023-06-04 NOTE — ED Notes (Signed)
Pt requesting breathing tx at 1445

## 2023-06-04 NOTE — ED Notes (Signed)
RN provided pt wash cloth, disposable underwear, sani-wipes, and hand sanitizer.

## 2023-06-04 NOTE — ED Notes (Signed)
Provided pt with beverage.

## 2023-06-04 NOTE — Progress Notes (Signed)
PROGRESS NOTE    Heather Harding  ZOX:[id number] DOB: 1975-08-27 DOA: 06/03/2023 PCP: Joylene Igo, MD   Brief Narrative:  HPI: Heather Harding is a 48 y.o. female with medical history significant of chronic persistent asthma, GERD, hypoplastic lung, congenital hypoplastic pulmonary artery who was exposed to the flu and came to the ER with significant shortness of breath and cough.  Patient was brought in with oxygen sats in the 60s.  She started having shortness of breath yesterday.  She tried her inhalers and breathing treatments at home.  Symptoms continue to escalate and get worse.  EMS were called where she get 5 mg of albuterol as well as continuous breathing treatment in the ER but still very hypoxic.  Oxygen sats went into the 70s initially.  Patient is tachypneic with heart rates in the 120s.  She also has a temperature 101 meeting technically speaking sepsis criteria.  Patient finally agreed to have BiPAP and she has stabilized on BiPAP still tachypneic.  She has been admitted with acute hypoxic respiratory failure most likely secondary to influenza infection.  Chest x-ray showed no secondary bacterial  pneumonia.   Assessment & Plan:   Principal Problem:   Acute on chronic respiratory failure with hypoxemia (HCC) Active Problems:   ANEMIA, IRON DEFICIENCY, UNSPEC.   Asthma   GASTROESOPHAGEAL REFLUX, NO ESOPHAGITIS   Hypoplasia of right lung   Morbid obesity (HCC)   Right pulmonary artery hypoplasia   Severe sepsis (HCC)  Severe sepsis and acute hypoxic respiratory failure secondary to influenza A infection/acute asthma exacerbation, POA: Patient met criteria for severe sepsis based on tachycardia, tachypnea and acute hypoxic respiratory failure requiring BiPAP.  She received 1 L of IV fluid bolus in the ED.  No lactic acid on chart.  Patient is still on BiPAP, although symptomatically somewhat improved.  Will check lactic acid, if elevated, will provide with fluid bolus.   Continue to try to wean from BiPAP.  Continue bronchodilators and steroids as well as antibiotics and Tamiflu for now.  GERD: PPI  Anemia of chronic disease: Hemoglobin stable.  Monitor.  Congenital right lung hypoplasia: Noted.  Morbid obesity class III: Weight loss and diet modification counseled.  DVT prophylaxis: enoxaparin (LOVENOX) injection 40 mg Start: 06/04/23 1000   Code Status: Full Code  Family Communication:  None present at bedside.  Plan of care discussed with patient in length and he/she verbalized understanding and agreed with it.  Status is: Inpatient Remains inpatient appropriate because: Still septic Still  Estimated body mass index is 40.35 kg/m as calculated from the following:   Height as of this encounter: 5\' 6"  (1.676 m).   Weight as of this encounter: 113.4 kg.    Nutritional Assessment: Body mass index is 40.35 kg/m.Marland Kitchen Seen by dietician.  I agree with the assessment and plan as outlined below: Nutrition Status:        . Skin Assessment: I have examined the patient's skin and I agree with the wound assessment as performed by the wound care RN as outlined below:    Consultants:  None  Procedures:  None  Antimicrobials:  Anti-infectives (From admission, onward)    Start     Dose/Rate Route Frequency Ordered Stop   06/04/23 0015  azithromycin (ZITHROMAX) 500 mg in sodium chloride 0.9 % 250 mL IVPB        500 mg 250 mL/hr over 60 Minutes Intravenous Daily at bedtime 06/04/23 0008     06/03/23 2200  oseltamivir (  TAMIFLU) capsule 75 mg        75 mg Oral 2 times daily 06/03/23 2131 06/08/23 2159   06/03/23 1900  oseltamivir (TAMIFLU) capsule 75 mg  Status:  Discontinued        75 mg Oral  Once 06/03/23 1853 06/03/23 2203         Subjective: Seen and examined, breathing much improved.  No other complaint.  Patient is still on BiPAP but appears very comfortable.  Per nurse, she is very apprehensive about coming off of BiPAP, RT tried but  she declined.  Had a lengthy discussion with the patient that we need to get her off of the BiPAP and see how much her O2 sats are.  She is also refusing Tamiflu.  Objective: Vitals:   06/04/23 0545 06/04/23 0600 06/04/23 0608 06/04/23 0615  BP:    (!) 147/104  Pulse: 97 100  90  Resp: (!) 23 (!) 23  (!) 27  Temp:   98.9 F (37.2 C)   TempSrc:   Oral   SpO2: 100% 100%  100%  Weight:      Height:        Intake/Output Summary (Last 24 hours) at 06/04/2023 0804 Last data filed at 06/03/2023 1846 Gross per 24 hour  Intake 50 ml  Output --  Net 50 ml   Filed Weights   06/03/23 1650  Weight: 113.4 kg    Examination:  General exam: Appears calm and comfortable, morbidly obese Respiratory system: Diminished breath sounds bilaterally, no wheezes or crackles.Marland Kitchen Respiratory effort normal. Cardiovascular system: S1 & S2 heard, RRR. No JVD, murmurs, rubs, gallops or clicks. No pedal edema. Gastrointestinal system: Abdomen is nondistended, soft and nontender. No organomegaly or masses felt. Normal bowel sounds heard. Central nervous system: Alert and oriented. No focal neurological deficits. Extremities: Symmetric 5 x 5 power. Skin: No rashes, lesions or ulcers Psychiatry: Judgement and insight appear normal. Mood & affect appropriate.    Data Reviewed: I have personally reviewed following labs and imaging studies  CBC: Recent Labs  Lab 06/03/23 1708 06/04/23 0404  WBC 9.4 8.2  NEUTROABS 8.1*  --   HGB 12.4 12.1  HCT 40.0 38.9  MCV 77.5* 78.1*  PLT 284 259   Basic Metabolic Panel: Recent Labs  Lab 06/03/23 1708 06/04/23 0404  NA 140 136  K 4.4 4.2  CL 103 102  CO2 25 21*  GLUCOSE 129* 152*  BUN 5* 6  CREATININE 1.00 1.03*  CALCIUM 9.4 8.7*   GFR: Estimated Creatinine Clearance: 86.2 mL/min (A) (by C-G formula based on SCr of 1.03 mg/dL (H)). Liver Function Tests: Recent Labs  Lab 06/03/23 1708 06/04/23 0404  AST 37 29  ALT 26 22  ALKPHOS 70 65  BILITOT 0.6  0.5  PROT 7.9 7.6  ALBUMIN 3.7 3.5   No results for input(s): "LIPASE", "AMYLASE" in the last 168 hours. No results for input(s): "AMMONIA" in the last 168 hours. Coagulation Profile: No results for input(s): "INR", "PROTIME" in the last 168 hours. Cardiac Enzymes: No results for input(s): "CKTOTAL", "CKMB", "CKMBINDEX", "TROPONINI" in the last 168 hours. BNP (last 3 results) No results for input(s): "PROBNP" in the last 8760 hours. HbA1C: No results for input(s): "HGBA1C" in the last 72 hours. CBG: No results for input(s): "GLUCAP" in the last 168 hours. Lipid Profile: No results for input(s): "CHOL", "HDL", "LDLCALC", "TRIG", "CHOLHDL", "LDLDIRECT" in the last 72 hours. Thyroid Function Tests: No results for input(s): "TSH", "T4TOTAL", "FREET4", "T3FREE", "THYROIDAB"  in the last 72 hours. Anemia Panel: No results for input(s): "VITAMINB12", "FOLATE", "FERRITIN", "TIBC", "IRON", "RETICCTPCT" in the last 72 hours. Sepsis Labs: No results for input(s): "PROCALCITON", "LATICACIDVEN" in the last 168 hours.  Recent Results (from the past 240 hours)  Resp panel by RT-PCR (RSV, Flu A&B, Covid) Anterior Nasal Swab     Status: Abnormal   Collection Time: 06/03/23  5:42 PM   Specimen: Anterior Nasal Swab  Result Value Ref Range Status   SARS Coronavirus 2 by RT PCR NEGATIVE NEGATIVE Final   Influenza A by PCR POSITIVE (A) NEGATIVE Final   Influenza B by PCR NEGATIVE NEGATIVE Final    Comment: (NOTE) The Xpert Xpress SARS-CoV-2/FLU/RSV plus assay is intended as an aid in the diagnosis of influenza from Nasopharyngeal swab specimens and should not be used as a sole basis for treatment. Nasal washings and aspirates are unacceptable for Xpert Xpress SARS-CoV-2/FLU/RSV testing.  Fact Sheet for Patients: BloggerCourse.com  Fact Sheet for Healthcare Providers: SeriousBroker.it  This test is not yet approved or cleared by the [place]  FDA and has been authorized for detection and/or diagnosis of SARS-CoV-2 by FDA under an Emergency Use Authorization (EUA). This EUA will remain in effect (meaning this test can be used) for the duration of the COVID-19 declaration under Section 564(b)(1) of the Act, 21 U.S.C. section 360bbb-3(b)(1), unless the authorization is terminated or revoked.     Resp Syncytial Virus by PCR NEGATIVE NEGATIVE Final    Comment: (NOTE) Fact Sheet for Patients: BloggerCourse.com  Fact Sheet for Healthcare Providers: SeriousBroker.it  This test is not yet approved or cleared by the [place] FDA and has been authorized for detection and/or diagnosis of SARS-CoV-2 by FDA under an Emergency Use Authorization (EUA). This EUA will remain in effect (meaning this test can be used) for the duration of the COVID-19 declaration under Section 564(b)(1) of the Act, 21 U.S.C. section 360bbb-3(b)(1), unless the authorization is terminated or revoked.  Performed at [facility], [address] [place], [place] [address]      Radiology Studies: DG Chest Portable 1 View Result Date: 06/03/2023 CLINICAL DATA:  Shortness of breath hand respiratory distress. Audible wheezing. Accessory muscle use. History of congenital hypoplasia of the right lung. EXAM: PORTABLE CHEST 1 VIEW COMPARISON:  Chest radiographs 07/23/2022 and 05/27/2021; CT chest 06/13/2019 FINDINGS: There is again rightward mediastinal shift, reportedly secondary to right lung congenital hypoplasia. The cardiac silhouette and mediastinal contours appear within normal limits. The left lung is clear. There is subtle veiled density overlying the inferior right hemithorax which is favored to be secondary to the projection, low lung volumes, and overlying epicardial fat pad. No definite pleural effusion. No pneumothorax. Mild levocurvature of the upper thoracic spine and moderate multilevel  degenerative disc changes. IMPRESSION: 1. Unchanged chronic rightward mediastinal shift, reportedly secondary to right lung congenital hypoplasia. 2. No definite acute cardiopulmonary process. Electronically Signed   By: Neita Garnet M.D.   On: 06/03/2023 17:34    Scheduled Meds:  enoxaparin (LOVENOX) injection  40 mg Subcutaneous Q24H   ipratropium-albuterol  3 mL Nebulization QID   methylPREDNISolone (SOLU-MEDROL) injection  125 mg Intravenous Q12H   ondansetron (ZOFRAN) IV  4 mg Intravenous Once   oseltamivir  75 mg Oral BID   Continuous Infusions:  acetaminophen Stopped (06/04/23 0043)   azithromycin Stopped (06/04/23 0154)   lactated ringers 40 mL/hr at 06/03/23 2244     LOS: 1 day   Hughie Closs, MD [facility]  06/04/2023, 8:04 AM   *Please note that this is a verbal dictation therefore any spelling or grammatical errors are due to the "Dragon Medical One" system interpretation.  Please page via Amion and do not message via secure chat for urgent patient care matters. Secure chat can be used for non urgent patient care matters.  How to contact the [facility] Attending or Consulting provider 7A - 7P or covering provider during after hours 7P -7A, for this patient?  Check the care team in [facility] and look for a) attending/consulting TR[facility] provider listed and b) the [facility] team listed. Page or secure chat 7A-7P. Log into [email or web address] and use 's universal password to access. If you do not have the password, please contact the hospital operator. Locate the [facility] provider you are looking for under [facility] and page to a number that you can be directly reached. If you still have difficulty reaching the provider, please page the [facility] (Director on Call) for the Hospitalists listed on amion for assistance.

## 2023-06-04 NOTE — ED Notes (Signed)
RN provided pt items to provide peri care and change linens

## 2023-06-04 NOTE — ED Notes (Signed)
Pt at bedside eating lunch

## 2023-06-05 DIAGNOSIS — J9621 Acute and chronic respiratory failure with hypoxia: Secondary | ICD-10-CM | POA: Diagnosis not present

## 2023-06-05 LAB — RESPIRATORY PANEL BY PCR

## 2023-06-05 LAB — BASIC METABOLIC PANEL
Anion gap: 9 (ref 5–15)
BUN: 10 mg/dL (ref 6–20)
CO2: 25 mmol/L (ref 22–32)
Calcium: 9.4 mg/dL (ref 8.9–10.3)
Chloride: 105 mmol/L (ref 98–111)
Creatinine, Ser: 0.9 mg/dL (ref 0.44–1.00)
GFR, Estimated: >60 mL/min (ref >60)
Glucose, Bld: 133 mg/dL — ABNORMAL HIGH (ref 70–99)
Potassium: 4.4 mmol/L (ref 3.5–5.1)
Sodium: 139 mmol/L (ref 135–145)

## 2023-06-05 LAB — LACTIC ACID, PLASMA: Lactic Acid, Venous: 1 mmol/L (ref 0.5–1.9)

## 2023-06-05 MED ORDER — METHYLPREDNISOLONE SODIUM SUCC 40 MG IJ SOLR
40.0000 mg | Freq: Two times a day (BID) | INTRAMUSCULAR | Status: DC
Start: 2023-06-05 — End: 2023-06-08
  Administered 2023-06-05 – 2023-06-06 (×3): 40 mg via INTRAVENOUS
  Filled 2023-06-05 (×4): qty 1

## 2023-06-05 MED ORDER — AZITHROMYCIN 500 MG PO TABS
500.0000 mg | ORAL_TABLET | Freq: Every day | ORAL | Status: DC
  Administered 2023-06-05 – 2023-06-07 (×3): 500 mg via ORAL
  Filled 2023-06-05 (×3): qty 1

## 2023-06-05 MED ORDER — IPRATROPIUM-ALBUTEROL 0.5-2.5 (3) MG/3ML IN SOLN
3.0000 mL | RESPIRATORY_TRACT | Status: DC
  Administered 2023-06-05 – 2023-06-07 (×12): 3 mL via RESPIRATORY_TRACT
  Filled 2023-06-05 (×13): qty 3

## 2023-06-05 MED ORDER — GUAIFENESIN-DM 100-10 MG/5ML PO SYRP
5.0000 mL | ORAL_SOLUTION | ORAL | Status: DC | PRN
  Administered 2023-06-06 – 2023-06-07 (×2): 5 mL via ORAL
  Filled 2023-06-05 (×2): qty 5

## 2023-06-05 MED ORDER — GUAIFENESIN-DM 100-10 MG/5ML PO SYRP
5.0000 mL | ORAL_SOLUTION | ORAL | Status: DC | PRN
  Filled 2023-06-05: qty 5

## 2023-06-05 NOTE — Progress Notes (Signed)
PT Cancellation Note  Patient Details Name: Heather Harding MRN: [id number] DOB: 09/23/1975   Cancelled Treatment:    Reason Eval/Treat Not Completed: Other (comment). Pt requested PT to come back later into the evening as pt reports "I'm finally recovering from my walk earlier today and I'm still labored. I can't get another breathing treatment until 3pm." Pt walked 20' with mobility team around 10am earlier today and is still recovering with 3LO2 via [place]. Acute PT to return as able to complete PT eval.   Lewis Shock, PT, DPT Acute Rehabilitation Services Secure chat preferred Office #: [phone]    Iona Hansen 06/05/2023, 1:48 PM

## 2023-06-05 NOTE — Progress Notes (Signed)
RPH Greg confirm that robitussin DM is ok to give as it is not Decongestive meds. Pt stated that she can not take something with decongestant meds.

## 2023-06-05 NOTE — Progress Notes (Signed)
OT Cancellation Note  Patient Details Name: Heather Harding MRN: [id number] DOB: 08-Oct-1975   Cancelled Treatment:    Reason Eval/Treat Not Completed: Other (comment). Pt on [facility] and states she will be there a while.   Lindon Romp OT Acute Rehabilitation Services Office [phone]    Evette Georges 06/05/2023, 3:55 PM

## 2023-06-05 NOTE — Progress Notes (Addendum)
SATURATION QUALIFICATIONS: (This note is used to comply with regulatory documentation for home oxygen)  Patient Saturations on Room Air at Rest = 95%  Patient Saturations on Room Air while Ambulating = 89%  Patient Saturations on 1 Liters of oxygen while Ambulating = 94%  Please briefly explain why patient needs home oxygen: Pt needing ~2 mins to recover after walking short distance in room (14ft) RR 20-mid 30s throughout session.

## 2023-06-05 NOTE — TOC Initial Note (Addendum)
Transition of Care [facility]) - Initial/Assessment Note    Patient Details  Name: Heather Harding MRN: [id number] Date of Birth: 29-Jun-1975  Transition of Care [facility]) CM/SW Contact:    Marliss Coots, LCSW Phone Number: 06/05/2023, 5:22 PM  Clinical Narrative:                  5:22 PM CSW called patient to introduce herself and role, as well as inquire about SDOH (utilities). Patient expressed interest in rent and utility assistance resources within [facility]. CSW provided [facility] DSS [employer] packet to bedside NT to provide to patient.    Barriers to Discharge: Continued Medical Work up   Patient Goals and CMS Choice            Expected Discharge Plan and Services In-house Referral: Clinical Social Work     Living arrangements for the past 2 months: Apartment                                      Prior Living Arrangements/Services Living arrangements for the past 2 months: Apartment Lives with:: Parents Patient language and need for interpreter reviewed:: Yes        Need for Family Participation in Patient Care: Yes (Comment) Care giver support system in place?: Yes (comment)   Criminal Activity/Legal Involvement Pertinent to Current Situation/Hospitalization: No - Comment as needed  Activities of Daily Living   ADL Screening (condition at time of admission) Independently performs ADLs?: No Does the patient have a NEW difficulty with bathing/dressing/toileting/self-feeding that is expected to last >3 days?: No (needs assist due to shortness of breath) Does the patient have a NEW difficulty with getting in/out of bed, walking, or climbing stairs that is expected to last >3 days?: No (neeeds assist due to shortness of breath) Does the patient have a NEW difficulty with communication that is expected to last >3 days?: No Is the patient deaf or have difficulty hearing?: No Does the patient have difficulty seeing, even when wearing  glasses/contacts?: No Does the patient have difficulty concentrating, remembering, or making decisions?: No  Permission Sought/Granted   Permission granted to share information with : No (Contact information on chart)  Share Information with NAME: Tamanna Whitson     Permission granted to share info w Relationship: Brother  Permission granted to share info w Contact Information: [phone]  Emotional Assessment   Attitude/Demeanor/Rapport: Engaged Affect (typically observed): Accepting, Appropriate, Adaptable, Calm, Stable Orientation: : Oriented to Self, Oriented to Place, Oriented to  Time, Oriented to Situation Alcohol / Substance Use: Not Applicable Psych Involvement: No (comment)  Admission diagnosis:  Acute respiratory failure with hypoxia (HCC) [J96.01] Acute on chronic respiratory failure with hypoxemia (HCC) [J96.21] Patient Active Problem List   Diagnosis Date Noted   Severe sepsis (HCC) 06/04/2023   Acute on chronic respiratory failure with hypoxemia (HCC) 06/03/2023   Shortness of breath 05/27/2021   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 07/05/2006   Asthma 07/05/2006   GASTROESOPHAGEAL REFLUX, NO ESOPHAGITIS 07/05/2006   PCP:  Joylene Igo, MD Pharmacy:   [facility] [address], [place] - [address] [place] [address] Phone: [phone] Fax: [phone]  [id number] - [place], [place] - [address] [place]  [address] Phone: [phone] Fax: [phone]  [id number] - [address] [place] - [address] [place] [address] Phone: [phone] Fax: [phone]     Social Drivers of Health (SDOH) Social History: SDOH Screenings   Food Insecurity: No Food Insecurity (06/04/2023)  Housing: Low Risk  (06/04/2023)  Transportation Needs:  No Transportation Needs (06/04/2023)  Utilities: At Risk (06/04/2023)  Depression (PHQ2-9): Low Risk  (10/19/2020)  Tobacco Use: Low Risk  (06/03/2023)   SDOH Interventions:     Readmission Risk Interventions     No data to display

## 2023-06-05 NOTE — Progress Notes (Signed)
Nurse requested Mobility Specialist to perform oxygen saturation test with pt which includes removing pt from oxygen both at rest and while ambulating.  Below are the results from that testing.     Patient Saturations on Room Air at Rest = spO2 97%  Patient Saturations on Room Air while Ambulating = sp02 84% .   Patient Saturations on 1 Liters of oxygen while Ambulating = sp02 96%  At end of testing pt left in room on 1  Liters of oxygen.  Reported results to nurse.    Caesar Bookman Mobility Specialist Please contact via SecureChat or [employer] [phone]

## 2023-06-05 NOTE — Evaluation (Signed)
Occupational Therapy Evaluation Patient Details Name: Heather Harding MRN: [id number] DOB: 13-Jan-1976 Today's Date: 06/05/2023   History of Present Illness 48 y/o F presenting to ED On 1/26 with SOB and cough SpO2 in the 60s. Admitted for acute hypoxic resp failure 2/2 influenza.    PMH Includes asthma, GERD, hypoplastic lung, congenital hypoplastic pulmonary artery   Clinical Impression   Pt reports ind at baseline with ADL/functional mobility, lives with her mother who can assist at d/c. Pt currently needing up to min A for ADLs, and CGA for transfers/ambulation in room without AD. Pt keeps trunk flexed with all mobility as she states it helps her "breathe easier". SpO2 down to 89% on RA, incr to 94% - mid 90s on 1L O2. Pt needing x3 standing rest breaks with walking ~21ft increments, with ~2 min to recover each time. Pt demonstrates good use of PLB throughout. Pt presenting with impairments listed below, will follow acutely. Recommend HHOT at d/c pending progression.      If plan is discharge home, recommend the following: A little help with walking and/or transfers;A little help with bathing/dressing/bathroom;Assistance with cooking/housework;Assist for transportation;Help with stairs or ramp for entrance    Functional Status Assessment  Patient has had a recent decline in their functional status and demonstrates the ability to make significant improvements in function in a reasonable and predictable amount of time.  Equipment Recommendations  Tub/shower seat    Recommendations for Other Services PT consult     Precautions / Restrictions Precautions Precautions: Fall Precaution Comments: watch O2 and RR Restrictions Weight Bearing Restrictions Per Provider Order: No      Mobility Bed Mobility               General bed mobility comments: seated EOB upon arrival, left EOB upon departure    Transfers Overall transfer level: Needs assistance Equipment used:  None Transfers: Sit to/from Stand Sit to Stand: Contact guard assist                  Balance Overall balance assessment: Mild deficits observed, not formally tested                                         ADL either performed or assessed with clinical judgement   ADL Overall ADL's : Needs assistance/impaired Eating/Feeding: Set up;Sitting   Grooming: Wash/dry hands;Standing   Upper Body Bathing: Minimal assistance   Lower Body Bathing: Minimal assistance   Upper Body Dressing : Minimal assistance   Lower Body Dressing: Minimal assistance   Toilet Transfer: Contact guard assist;Ambulation   Toileting- Clothing Manipulation and Hygiene: Supervision/safety       Functional mobility during ADLs: Contact guard assist       Vision   Vision Assessment?: No apparent visual deficits     Perception Perception: Not tested       Praxis Praxis: Not tested       Pertinent Vitals/Pain Pain Assessment Pain Assessment: No/denies pain     Extremity/Trunk Assessment Upper Extremity Assessment Upper Extremity Assessment: Overall WFL for tasks assessed   Lower Extremity Assessment Lower Extremity Assessment: Defer to PT evaluation   Cervical / Trunk Assessment Cervical / Trunk Assessment: Other exceptions Cervical / Trunk Exceptions: able to stand upright, but keeps trunk flexed due to SOB   Communication Communication Communication: No apparent difficulties   Cognition Arousal: Alert Behavior During Therapy:  WFL for tasks assessed/performed Overall Cognitive Status: Within Functional Limits for tasks assessed                                       General Comments  SpO2 down to 89% on RA, satting 94% on 1L o2    Exercises     Shoulder Instructions      Home Living Family/patient expects to be discharged to:: Private residence Living Arrangements: Parent (mom) Available Help at Discharge: Family;Available 24  hours/day Type of Home: Apartment Home Access: Level entry     Home Layout: One level     Bathroom Shower/Tub: Tub/shower unit         Home Equipment: None   Additional Comments: pt has nebulizer machine; does not wear O2 at home      Prior Functioning/Environment Prior Level of Function : Independent/Modified Independent;Driving;Working/employed             Mobility Comments: no AD use ADLs Comments: ind; works as an Print production planner at [employer]        OT Problem List: Decreased strength;Decreased range of motion;Decreased activity tolerance;Impaired balance (sitting and/or standing);Cardiopulmonary status limiting activity;Decreased safety awareness      OT Treatment/Interventions: Therapeutic exercise;Self-care/ADL training;Energy conservation;DME and/or AE instruction;Therapeutic activities;Patient/family education;Balance training    OT Goals(Current goals can be found in the care plan section) Acute Rehab OT Goals Patient Stated Goal: none stated OT Goal Formulation: With patient Time For Goal Achievement: 06/19/23 Potential to Achieve Goals: Good ADL Goals Pt Will Perform Upper Body Dressing: with modified independence;sitting Pt Will Perform Lower Body Dressing: with modified independence;sitting/lateral leans;sit to/from stand Pt Will Transfer to Toilet: with modified independence;ambulating;regular height toilet Pt Will Perform Tub/Shower Transfer: Tub transfer;Shower transfer;with modified independence;ambulating;shower seat Additional ADL Goal #1: pt will tolerate OOB activity x10 min with SpO2 above 90% in prep for ADLs  OT Frequency: Min 1X/week    Co-evaluation              AM-PAC OT "6 Clicks" Daily Activity     Outcome Measure Help from another person eating meals?: None Help from another person taking care of personal grooming?: None Help from another person toileting, which includes using toliet, bedpan, or urinal?: A Little Help from another  person bathing (including washing, rinsing, drying)?: A Little Help from another person to put on and taking off regular upper body clothing?: A Little Help from another person to put on and taking off regular lower body clothing?: A Little 6 Click Score: 20   End of Session Equipment Utilized During Treatment: Oxygen Nurse Communication: Mobility status  Activity Tolerance: Patient tolerated treatment well Patient left: in bed;with call bell/phone within reach  OT Visit Diagnosis: Unsteadiness on feet (R26.81);Other abnormalities of gait and mobility (R26.89);Muscle weakness (generalized) (M62.81)                Time: 1610-9604 OT Time Calculation (min): 34 min Charges:  OT General Charges $OT Visit: 1 Visit OT Evaluation $OT Eval Moderate Complexity: 1 Mod OT Treatments $Therapeutic Activity: 8-22 mins  Carver Fila, OTD, OTR/L SecureChat Preferred Acute Rehab [phone]   Carver Fila Koonce 06/05/2023, 5:01 PM

## 2023-06-05 NOTE — Plan of Care (Signed)

## 2023-06-05 NOTE — Progress Notes (Signed)
PROGRESS NOTE    Heather Harding  WUJ:[id number] DOB: [birth year] DOA: 06/03/2023 PCP: Joylene Igo, MD   Brief Narrative:  HPI: Heather Harding is a 48 y.o. female with medical history significant of chronic persistent asthma, GERD, hypoplastic lung, congenital hypoplastic pulmonary artery who was exposed to the flu and came to the ER with significant shortness of breath and cough.  Patient was brought in with oxygen sats in the 60s.  She started having shortness of breath yesterday.  She tried her inhalers and breathing treatments at home.  Symptoms continue to escalate and get worse.  EMS were called where she get 5 mg of albuterol as well as continuous breathing treatment in the ER but still very hypoxic.  Oxygen sats went into the 70s initially.  Patient is tachypneic with heart rates in the 120s.  She also has a temperature 101 meeting technically speaking sepsis criteria.  Patient finally agreed to have BiPAP and she has stabilized on BiPAP still tachypneic.  She has been admitted with acute hypoxic respiratory failure most likely secondary to influenza infection.  Chest x-ray showed no secondary bacterial  pneumonia.   Assessment & Plan:   Principal Problem:   Acute on chronic respiratory failure with hypoxemia (HCC) Active Problems:   ANEMIA, IRON DEFICIENCY, UNSPEC.   Asthma   GASTROESOPHAGEAL REFLUX, NO ESOPHAGITIS   Hypoplasia of right lung   Morbid obesity (HCC)   Right pulmonary artery hypoplasia   Severe sepsis (HCC)  Severe sepsis and acute hypoxic respiratory failure secondary to influenza A infection/acute asthma exacerbation, POA: Patient met criteria for severe sepsis based on tachycardia, tachypnea and acute hypoxic respiratory failure requiring BiPAP.  She received 1 L of IV fluid bolus in the ED. lactic acid normal.  Patient clinically appears to have improved significantly, currently she is only on 2 L of oxygen, we checked her ambulatory oximetry, she required 1  L.  She is still complaining of shortness of breath and claims that she does not feel any better than yesterday.  Patient has remained very apprehensive about going home and asking to keep her on oxygen.  She had no wheezes on examination today but due to hypoxia, we are going to keep her today, we will continue Solu-Medrol but reduce the dose to 40 mg IV twice daily, continue bronchodilators.  I have ordered incentive spirometry, nurse is advised to provide and educate.  PT OT is consulted.  Potential discharge tomorrow.    GERD: PPI  Anemia of chronic disease: Hemoglobin stable.  Monitor.  Congenital right lung hypoplasia: Noted.  Morbid obesity class III: Weight loss and diet modification counseled.  DVT prophylaxis: enoxaparin (LOVENOX) injection 40 mg Start: 06/04/23 1000   Code Status: Full Code  Family Communication: Mother present at bedside.  Plan of care discussed with patient in length and he/she verbalized understanding and agreed with it.  Status is: Inpatient Remains inpatient appropriate because: Still hypoxic.  Estimated body mass index is 40.35 kg/m as calculated from the following:   Height as of this encounter: 5\' 6"  (1.676 m).   Weight as of this encounter: 113.4 kg.    Nutritional Assessment: Body mass index is 40.35 kg/m.Marland Kitchen Seen by dietician.  I agree with the assessment and plan as outlined below: Nutrition Status:        . Skin Assessment: I have examined the patient's skin and I agree with the wound assessment as performed by the wound care RN as outlined below:  Consultants:  None  Procedures:  None  Antimicrobials:  Anti-infectives (From admission, onward)    Start     Dose/Rate Route Frequency Ordered Stop   06/04/23 0015  azithromycin (ZITHROMAX) 500 mg in sodium chloride 0.9 % 250 mL IVPB        500 mg 250 mL/hr over 60 Minutes Intravenous Daily at bedtime 06/04/23 0008     06/03/23 2200  oseltamivir (TAMIFLU) capsule 75 mg        75  mg Oral 2 times daily 06/03/23 2131 06/08/23 2159   06/03/23 1900  oseltamivir (TAMIFLU) capsule 75 mg  Status:  Discontinued        75 mg Oral  Once 06/03/23 1853 06/03/23 2203         Subjective: Patient seen and examined, mother at the bedside.  Patient claims that she is not feeling any better than yesterday however her oxygen demand has improved significantly and she does not have wheezes today like she had yesterday.  Clinically she is much better.  The mother at the bedside asked many questions such as " why she is getting breathing treatments if you think she is better".  And questions like these.  I answered all of questions patiently and few of them repeatedly.    Objective: Vitals:   06/04/23 1954 06/04/23 2339 06/05/23 0333 06/05/23 0731  BP:  (!) 149/100 (!) 149/85 (!) 153/90  Pulse:  96 86 96  Resp:  (!) 24 20 16   Temp:  98.9 F (37.2 C) 98.7 F (37.1 C) 98.7 F (37.1 C)  TempSrc:  Oral Oral Oral  SpO2: 97% 97% 98% 99%  Weight:      Height:        Intake/Output Summary (Last 24 hours) at 06/05/2023 1115 Last data filed at 06/05/2023 0354 Gross per 24 hour  Intake 469.39 ml  Output --  Net 469.39 ml   Filed Weights   06/03/23 1650  Weight: 113.4 kg    Examination:  General exam: Appears slightly dyspneic, obese Respiratory system: Clear to auscultation.  Slightly dyspneic Cardiovascular system: S1 & S2 heard, RRR. No JVD, murmurs, rubs, gallops or clicks. No pedal edema. Gastrointestinal system: Abdomen is nondistended, soft and nontender. No organomegaly or masses felt. Normal bowel sounds heard. Central nervous system: Alert and oriented. No focal neurological deficits. Extremities: Symmetric 5 x 5 power. Skin: No rashes, lesions or ulcers.  Psychiatry: Judgement and insight appear normal. Mood & affect appropriate.   Data Reviewed: I have personally reviewed following labs and imaging studies  CBC: Recent Labs  Lab 06/03/23 1708 06/04/23 0404   WBC 9.4 8.2  NEUTROABS 8.1*  --   HGB 12.4 12.1  HCT 40.0 38.9  MCV 77.5* 78.1*  PLT 284 259   Basic Metabolic Panel: Recent Labs  Lab 06/03/23 1708 06/04/23 0404 06/05/23 0841  NA 140 136 139  K 4.4 4.2 4.4  CL 103 102 105  CO2 25 21* 25  GLUCOSE 129* 152* 133*  BUN 5* 6 10  CREATININE 1.00 1.03* 0.90  CALCIUM 9.4 8.7* 9.4   GFR: Estimated Creatinine Clearance: 98.7 mL/min (by C-G formula based on SCr of 0.9 mg/dL). Liver Function Tests: Recent Labs  Lab 06/03/23 1708 06/04/23 0404  AST 37 29  ALT 26 22  ALKPHOS 70 65  BILITOT 0.6 0.5  PROT 7.9 7.6  ALBUMIN 3.7 3.5   No results for input(s): "LIPASE", "AMYLASE" in the last 168 hours. No results for input(s): "AMMONIA" in  the last 168 hours. Coagulation Profile: No results for input(s): "INR", "PROTIME" in the last 168 hours. Cardiac Enzymes: No results for input(s): "CKTOTAL", "CKMB", "CKMBINDEX", "TROPONINI" in the last 168 hours. BNP (last 3 results) No results for input(s): "PROBNP" in the last 8760 hours. HbA1C: No results for input(s): "HGBA1C" in the last 72 hours. CBG: No results for input(s): "GLUCAP" in the last 168 hours. Lipid Profile: No results for input(s): "CHOL", "HDL", "LDLCALC", "TRIG", "CHOLHDL", "LDLDIRECT" in the last 72 hours. Thyroid Function Tests: No results for input(s): "TSH", "T4TOTAL", "FREET4", "T3FREE", "THYROIDAB" in the last 72 hours. Anemia Panel: No results for input(s): "VITAMINB12", "FOLATE", "FERRITIN", "TIBC", "IRON", "RETICCTPCT" in the last 72 hours. Sepsis Labs: Recent Labs  Lab 06/05/23 0217  LATICACIDVEN 1.0    Recent Results (from the past 240 hours)  Resp panel by RT-PCR (RSV, Flu A&B, Covid) Anterior Nasal Swab     Status: Abnormal   Collection Time: 06/03/23  5:42 PM   Specimen: Anterior Nasal Swab  Result Value Ref Range Status   SARS Coronavirus 2 by RT PCR NEGATIVE NEGATIVE Final   Influenza A by PCR POSITIVE (A) NEGATIVE Final   Influenza B by  PCR NEGATIVE NEGATIVE Final    Comment: (NOTE) The Xpert Xpress SARS-CoV-2/FLU/RSV plus assay is intended as an aid in the diagnosis of influenza from Nasopharyngeal swab specimens and should not be used as a sole basis for treatment. Nasal washings and aspirates are unacceptable for Xpert Xpress SARS-CoV-2/FLU/RSV testing.  Fact Sheet for Patients: BloggerCourse.com  Fact Sheet for Healthcare Providers: SeriousBroker.it  This test is not yet approved or cleared by the [place] FDA and has been authorized for detection and/or diagnosis of SARS-CoV-2 by FDA under an Emergency Use Authorization (EUA). This EUA will remain in effect (meaning this test can be used) for the duration of the COVID-19 declaration under Section 564(b)(1) of the Act, 21 U.S.C. section 360bbb-3(b)(1), unless the authorization is terminated or revoked.     Resp Syncytial Virus by PCR NEGATIVE NEGATIVE Final    Comment: (NOTE) Fact Sheet for Patients: BloggerCourse.com  Fact Sheet for Healthcare Providers: SeriousBroker.it  This test is not yet approved or cleared by the [place] FDA and has been authorized for detection and/or diagnosis of SARS-CoV-2 by FDA under an Emergency Use Authorization (EUA). This EUA will remain in effect (meaning this test can be used) for the duration of the COVID-19 declaration under Section 564(b)(1) of the Act, 21 U.S.C. section 360bbb-3(b)(1), unless the authorization is terminated or revoked.  Performed at [facility], [address] [place], [place] [address]   MRSA Next Gen by PCR, Nasal     Status: None   Collection Time: 06/04/23  7:07 PM   Specimen: Nasal Mucosa; Nasal Swab  Result Value Ref Range Status   MRSA by PCR Next Gen NOT DETECTED NOT DETECTED Final    Comment: (NOTE) The GeneXpert MRSA Assay (FDA approved for NASAL specimens only), is  one component of a comprehensive MRSA colonization surveillance program. It is not intended to diagnose MRSA infection nor to guide or monitor treatment for MRSA infections. Test performance is not FDA approved in patients less than 12 years old. Performed at [facility], [address] [place], [place] [address]      Radiology Studies: DG Chest Portable 1 View Result Date: 06/03/2023 CLINICAL DATA:  Shortness of breath hand respiratory distress. Audible wheezing. Accessory muscle use. History of congenital hypoplasia of the right  lung. EXAM: PORTABLE CHEST 1 VIEW COMPARISON:  Chest radiographs 07/23/2022 and 05/27/2021; CT chest 06/13/2019 FINDINGS: There is again rightward mediastinal shift, reportedly secondary to right lung congenital hypoplasia. The cardiac silhouette and mediastinal contours appear within normal limits. The left lung is clear. There is subtle veiled density overlying the inferior right hemithorax which is favored to be secondary to the projection, low lung volumes, and overlying epicardial fat pad. No definite pleural effusion. No pneumothorax. Mild levocurvature of the upper thoracic spine and moderate multilevel degenerative disc changes. IMPRESSION: 1. Unchanged chronic rightward mediastinal shift, reportedly secondary to right lung congenital hypoplasia. 2. No definite acute cardiopulmonary process. Electronically Signed   By: Neita Garnet M.D.   On: 06/03/2023 17:34    Scheduled Meds:  enoxaparin (LOVENOX) injection  40 mg Subcutaneous Q24H   ipratropium-albuterol  3 mL Nebulization QID   methylPREDNISolone (SOLU-MEDROL) injection  40 mg Intravenous Q12H   ondansetron (ZOFRAN) IV  4 mg Intravenous Once   oseltamivir  75 mg Oral BID   Continuous Infusions:  azithromycin 500 mg (06/04/23 2028)     LOS: 2 days   Hughie Closs, MD [facility]  06/05/2023, 11:15 AM   *Please note that this is a verbal dictation therefore any spelling or grammatical  errors are due to the "Dragon Medical One" system interpretation.  Please page via Amion and do not message via secure chat for urgent patient care matters. Secure chat can be used for non urgent patient care matters.  How to contact the [facility] Attending or Consulting provider 7A - 7P or covering provider during after hours 7P -7A, for this patient?  Check the care team in [facility] and look for a) attending/consulting TRH provider listed and b) the [facility] team listed. Page or secure chat 7A-7P. Log into [email or web address] and use [place]'s universal password to access. If you do not have the password, please contact the hospital operator. Locate the [facility] provider you are looking for under [facility] and page to a number that you can be directly reached. If you still have difficulty reaching the provider, please page the [facility] (Director on Call) for the Hospitalists listed on amion for assistance.

## 2023-06-05 NOTE — Progress Notes (Signed)
Mobility Specialist Progress Note;   06/05/23 0950  Mobility  Activity Ambulated with assistance in hallway  Level of Assistance Contact guard assist, steadying assist  Assistive Device Front wheel walker  Distance Ambulated (ft) 20 ft  Activity Response Tolerated fair  Mobility Referral Yes  Mobility visit 1 Mobility  Mobility Specialist Start Time (ACUTE ONLY) 0950  Mobility Specialist Stop Time (ACUTE ONLY) 1017  Mobility Specialist Time Calculation (min) (ACUTE ONLY) 27 min   Pt agreeable to mobility and walk test. Required MinG assistance throughout ambulation for safety. Took 1x standing rest break d/t mod SOB and wheezing. Able to ambulate on RA, however once returned to room pt desat to 84% requiring 1LO2 to stay [facility]. Pt left on EOB with all needs met. RN notified and in room w/ pt.   Caesar Bookman Mobility Specialist Please contact via SecureChat or [employer] [phone]

## 2023-06-06 DIAGNOSIS — J9621 Acute and chronic respiratory failure with hypoxia: Secondary | ICD-10-CM | POA: Diagnosis not present

## 2023-06-06 NOTE — Progress Notes (Signed)
PROGRESS NOTE    Heather Harding  WGN:[id number] DOB: 1976/02/11 DOA: 06/03/2023 PCP: Joylene Igo, MD   Brief Narrative:  HPI: Heather Harding is a 48 y.o. female with medical history significant of chronic persistent asthma, GERD, hypoplastic lung, congenital hypoplastic pulmonary artery who was exposed to the flu and came to the ER with significant shortness of breath and cough.  Patient was brought in with oxygen sats in the 60s.  She started having shortness of breath yesterday.  She tried her inhalers and breathing treatments at home.  Symptoms continue to escalate and get worse.  EMS were called where she get 5 mg of albuterol as well as continuous breathing treatment in the ER but still very hypoxic.  Oxygen sats went into the 70s initially.  Patient is tachypneic with heart rates in the 120s.  She also has a temperature 101 meeting technically speaking sepsis criteria.  Patient finally agreed to have BiPAP and she has stabilized on BiPAP still tachypneic.  She has been admitted with acute hypoxic respiratory failure most likely secondary to influenza infection.  Chest x-ray showed no secondary bacterial  pneumonia.   Assessment & Plan:   Principal Problem:   Acute on chronic respiratory failure with hypoxemia (HCC) Active Problems:   ANEMIA, IRON DEFICIENCY, UNSPEC.   Asthma   GASTROESOPHAGEAL REFLUX, NO ESOPHAGITIS   Hypoplasia of right lung   Morbid obesity (HCC)   Right pulmonary artery hypoplasia   Severe sepsis (HCC)  Severe sepsis and acute hypoxic respiratory failure secondary to influenza A infection/acute asthma exacerbation, POA: Patient met criteria for severe sepsis based on tachycardia, tachypnea and acute hypoxic respiratory failure requiring BiPAP.  She received 1 L of IV fluid bolus in the ED. lactic acid normal.  Patient clinically appears to have improved significantly, currently she is only on 2 L of oxygen, we checked her ambulatory oximetry, she required 1 L  however today she says that she is slightly better than yesterday.  We did ambulatory oximetry and she was still requiring oxygen, became tachycardic to 130 and tachypneic with 40 rate.  Does not appear to be ready for discharge.  Will continue Solu-Medrol IV today, transition to prednisone tomorrow, continue bronchodilators and azithromycin.  GERD: PPI  Anemia of chronic disease: Hemoglobin stable.  Monitor.  Congenital right lung hypoplasia: Noted.  Morbid obesity class III: Weight loss and diet modification counseled.  DVT prophylaxis: enoxaparin (LOVENOX) injection 40 mg Start: 06/04/23 1000   Code Status: Full Code  Family Communication: Mother present at bedside.  Plan of care discussed with patient in length and he/she verbalized understanding and agreed with it.  Status is: Inpatient Remains inpatient appropriate because: Still hypoxic and symptomatic.  Estimated body mass index is 40.35 kg/m as calculated from the following:   Height as of this encounter: 5\' 6"  (1.676 m).   Weight as of this encounter: 113.4 kg.    Nutritional Assessment: Body mass index is 40.35 kg/m.Marland Kitchen Seen by dietician.  I agree with the assessment and plan as outlined below: Nutrition Status:        . Skin Assessment: I have examined the patient's skin and I agree with the wound assessment as performed by the wound care RN as outlined below:    Consultants:  None  Procedures:  None  Antimicrobials:  Anti-infectives (From admission, onward)    Start     Dose/Rate Route Frequency Ordered Stop   06/05/23 2200  azithromycin (ZITHROMAX) tablet 500 mg  500 mg Oral Daily at bedtime 06/05/23 1134     06/04/23 0015  azithromycin (ZITHROMAX) 500 mg in sodium chloride 0.9 % 250 mL IVPB  Status:  Discontinued        500 mg 250 mL/hr over 60 Minutes Intravenous Daily at bedtime 06/04/23 0008 06/05/23 1134   06/03/23 2200  oseltamivir (TAMIFLU) capsule 75 mg        75 mg Oral 2 times daily  06/03/23 2131 06/08/23 2159   06/03/23 1900  oseltamivir (TAMIFLU) capsule 75 mg  Status:  Discontinued        75 mg Oral  Once 06/03/23 1853 06/03/23 2203         Subjective: Patient seen and examined.  She is feeling slightly better.  Patient and mother at the bedside, extremely apprehensive about going home.  Very very lengthy discussion with the mother, I spent 20 minutes answering her questions and counseling her apprehensions.  She told me that she spoke to patient's asked my daughter and " they think that she is not ready for discharge yet and should not be discharged too soon".  Mother was respectfully reminded that patient is under our care and someone who is not seeing the patient should not be making a decision about discharge.  She was reassured that patient will be discharged only when the medical team will feel appropriate and we will meet all discharge criteria.  To that, her mother said " 100% oxygen for my daughter means 50% because she has only 1 lung".  She was also educated on that as well.  Bottom line, I spent great length of time answering all the questions yet again but unfortunately the mother has her own beliefs.  Patient has no wheezes today.  Objective: Vitals:   06/05/23 1958 06/06/23 0010 06/06/23 0345 06/06/23 0827  BP: (!) 166/103 (!) 156/96 (!) 148/71 (!) 165/96  Pulse: (!) 105 95 96 (!) 107  Resp: 20 13  16   Temp: 98.7 F (37.1 C) 98.3 F (36.8 C) 98.8 F (37.1 C) 99 F (37.2 C)  TempSrc: Oral Oral Oral Oral  SpO2: 96% 95% 100% 95%  Weight:      Height:        Intake/Output Summary (Last 24 hours) at 06/06/2023 1247 Last data filed at 06/05/2023 1700 Gross per 24 hour  Intake 600 ml  Output --  Net 600 ml   Filed Weights   06/03/23 1650  Weight: 113.4 kg    Examination:  General exam: Appears slightly dyspneic, obese Respiratory system: Clear to auscultation.  Slightly dyspneic Cardiovascular system: S1 & S2 heard, RRR. No JVD, murmurs,  rubs, gallops or clicks. No pedal edema. Gastrointestinal system: Abdomen is nondistended, soft and nontender. No organomegaly or masses felt. Normal bowel sounds heard. Central nervous system: Alert and oriented. No focal neurological deficits. Extremities: Symmetric 5 x 5 power. Skin: No rashes, lesions or ulcers.  Psychiatry: Judgement and insight appear normal. Mood & affect appropriate.   Data Reviewed: I have personally reviewed following labs and imaging studies  CBC: Recent Labs  Lab 06/03/23 1708 06/04/23 0404  WBC 9.4 8.2  NEUTROABS 8.1*  --   HGB 12.4 12.1  HCT 40.0 38.9  MCV 77.5* 78.1*  PLT 284 259   Basic Metabolic Panel: Recent Labs  Lab 06/03/23 1708 06/04/23 0404 06/05/23 0841  NA 140 136 139  K 4.4 4.2 4.4  CL 103 102 105  CO2 25 21* 25  GLUCOSE 129* 152* 133*  BUN 5* 6 10  CREATININE 1.00 1.03* 0.90  CALCIUM 9.4 8.7* 9.4   GFR: Estimated Creatinine Clearance: 98.7 mL/min (by C-G formula based on SCr of 0.9 mg/dL). Liver Function Tests: Recent Labs  Lab 06/03/23 1708 06/04/23 0404  AST 37 29  ALT 26 22  ALKPHOS 70 65  BILITOT 0.6 0.5  PROT 7.9 7.6  ALBUMIN 3.7 3.5   No results for input(s): "LIPASE", "AMYLASE" in the last 168 hours. No results for input(s): "AMMONIA" in the last 168 hours. Coagulation Profile: No results for input(s): "INR", "PROTIME" in the last 168 hours. Cardiac Enzymes: No results for input(s): "CKTOTAL", "CKMB", "CKMBINDEX", "TROPONINI" in the last 168 hours. BNP (last 3 results) No results for input(s): "PROBNP" in the last 8760 hours. HbA1C: No results for input(s): "HGBA1C" in the last 72 hours. CBG: No results for input(s): "GLUCAP" in the last 168 hours. Lipid Profile: No results for input(s): "CHOL", "HDL", "LDLCALC", "TRIG", "CHOLHDL", "LDLDIRECT" in the last 72 hours. Thyroid Function Tests: No results for input(s): "TSH", "T4TOTAL", "FREET4", "T3FREE", "THYROIDAB" in the last 72 hours. Anemia Panel: No  results for input(s): "VITAMINB12", "FOLATE", "FERRITIN", "TIBC", "IRON", "RETICCTPCT" in the last 72 hours. Sepsis Labs: Recent Labs  Lab 06/05/23 0217  LATICACIDVEN 1.0    Recent Results (from the past 240 hours)  Resp panel by RT-PCR (RSV, Flu A&B, Covid) Anterior Nasal Swab     Status: Abnormal   Collection Time: 06/03/23  5:42 PM   Specimen: Anterior Nasal Swab  Result Value Ref Range Status   SARS Coronavirus 2 by RT PCR NEGATIVE NEGATIVE Final   Influenza A by PCR POSITIVE (A) NEGATIVE Final   Influenza B by PCR NEGATIVE NEGATIVE Final    Comment: (NOTE) The Xpert Xpress SARS-CoV-2/FLU/RSV plus assay is intended as an aid in the diagnosis of influenza from Nasopharyngeal swab specimens and should not be used as a sole basis for treatment. Nasal washings and aspirates are unacceptable for Xpert Xpress SARS-CoV-2/FLU/RSV testing.  Fact Sheet for Patients: BloggerCourse.com  Fact Sheet for Healthcare Providers: SeriousBroker.it  This test is not yet approved or cleared by the [place] FDA and has been authorized for detection and/or diagnosis of SARS-CoV-2 by FDA under an Emergency Use Authorization (EUA). This EUA will remain in effect (meaning this test can be used) for the duration of the COVID-19 declaration under Section 564(b)(1) of the Act, 21 U.S.C. section 360bbb-3(b)(1), unless the authorization is terminated or revoked.     Resp Syncytial Virus by PCR NEGATIVE NEGATIVE Final    Comment: (NOTE) Fact Sheet for Patients: BloggerCourse.com  Fact Sheet for Healthcare Providers: SeriousBroker.it  This test is not yet approved or cleared by the [place] FDA and has been authorized for detection and/or diagnosis of SARS-CoV-2 by FDA under an Emergency Use Authorization (EUA). This EUA will remain in effect (meaning this test can be used) for the duration  of the COVID-19 declaration under Section 564(b)(1) of the Act, 21 U.S.C. section 360bbb-3(b)(1), unless the authorization is terminated or revoked.  Performed at [facility], [address] [place], [place] [address]   MRSA Next Gen by PCR, Nasal     Status: None   Collection Time: 06/04/23  7:07 PM   Specimen: Nasal Mucosa; Nasal Swab  Result Value Ref Range Status   MRSA by PCR Next Gen NOT DETECTED NOT DETECTED Final    Comment: (NOTE) The GeneXpert MRSA Assay (FDA approved for NASAL specimens only), is one component of  a comprehensive MRSA colonization surveillance program. It is not intended to diagnose MRSA infection nor to guide or monitor treatment for MRSA infections. Test performance is not FDA approved in patients less than 45 years old. Performed at [facility], [address], [place], [place] [address]   Respiratory (~20 pathogens) panel by PCR     Status: Abnormal   Collection Time: 06/05/23 11:10 AM   Specimen: Nasopharyngeal Swab; Respiratory  Result Value Ref Range Status   Adenovirus NOT DETECTED NOT DETECTED Final   Coronavirus 229E NOT DETECTED NOT DETECTED Final    Comment: (NOTE) The Coronavirus on the Respiratory Panel, DOES NOT test for the novel  Coronavirus (2019 nCoV)    Coronavirus HKU1 NOT DETECTED NOT DETECTED Final   Coronavirus NL63 NOT DETECTED NOT DETECTED Final   Coronavirus OC43 NOT DETECTED NOT DETECTED Final   Metapneumovirus NOT DETECTED NOT DETECTED Final   Rhinovirus / Enterovirus NOT DETECTED NOT DETECTED Final   Influenza A H1 2009 DETECTED (A) NOT DETECTED Final   Influenza B NOT DETECTED NOT DETECTED Final   Parainfluenza Virus 1 NOT DETECTED NOT DETECTED Final   Parainfluenza Virus 2 NOT DETECTED NOT DETECTED Final   Parainfluenza Virus 3 NOT DETECTED NOT DETECTED Final   Parainfluenza Virus 4 NOT DETECTED NOT DETECTED Final   Respiratory Syncytial Virus NOT DETECTED NOT DETECTED Final   Bordetella pertussis  NOT DETECTED NOT DETECTED Final   Bordetella Parapertussis NOT DETECTED NOT DETECTED Final   Chlamydophila pneumoniae NOT DETECTED NOT DETECTED Final   Mycoplasma pneumoniae NOT DETECTED NOT DETECTED Final    Comment: Performed at [facility], [address], [place], [place] [address]     Radiology Studies: No results found.   Scheduled Meds:  azithromycin  500 mg Oral QHS   enoxaparin (LOVENOX) injection  40 mg Subcutaneous Q24H   ipratropium-albuterol  3 mL Nebulization Q4H   methylPREDNISolone (SOLU-MEDROL) injection  40 mg Intravenous Q12H   ondansetron (ZOFRAN) IV  4 mg Intravenous Once   oseltamivir  75 mg Oral BID   Continuous Infusions:     LOS: 3 days   Hughie Closs, MD [facility]  06/06/2023, 12:47 PM   *Please note that this is a verbal dictation therefore any spelling or grammatical errors are due to the "Dragon Medical One" system interpretation.  Please page via Amion and do not message via secure chat for urgent patient care matters. Secure chat can be used for non urgent patient care matters.  How to contact the [facility] Attending or Consulting provider 7A - 7P or covering provider during after hours 7P -7A, for this patient?  Check the care team in [facility] and look for a) attending/consulting [facility] provider listed and b) the [facility] team listed. Page or secure chat 7A-7P. Log into [email or web address] and use [place]'s universal password to access. If you do not have the password, please contact the hospital operator. Locate the [facility] provider you are looking for under [facility] and page to a number that you can be directly reached. If you still have difficulty reaching the provider, please page the [facility] (Director on Call) for the Hospitalists listed on amion for assistance.

## 2023-06-06 NOTE — Evaluation (Signed)
Physical Therapy Evaluation Patient Details Name: Heather Harding MRN: [id number] DOB: 05-23-1975 Today's Date: 06/06/2023  History of Present Illness  48 y/o F presenting to ED On 1/26 with SOB and cough SpO2 in the 60s. Admitted for acute hypoxic resp failure 2/2 influenza.    PMH Includes asthma, GERD, hypoplastic lung, congenital hypoplastic pulmonary artery   Clinical Impression  Pt admitted with above. 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 Pt now presenting with SOB, tachycardia,  increased RR, and is requiring supplemental O2 for mobility. Pt with decreased activity tolerance limiting ambulation ability. Pt's SPO2 stayed at 93-94% on 1lO2 via [place] while amb with RW but dropped to 88% on RA s/p 15' of ambulation. Pt to benefit from rolling walker for energy conservation purposes to allow for increased amb ability. Pt to benefit from use of rollator at home to allow for ambulation progression yet provide a safe place to sit when needed. Acute PT to cont to follow.        If plan is discharge home, recommend the following: A little help with walking and/or transfers;A little help with bathing/dressing/bathroom;Help with stairs or ramp for entrance;Assist for transportation   Can travel by private vehicle        Equipment Recommendations Rollator (4 wheels)  Recommendations for Other Services       Functional Status Assessment Patient has had a recent decline in their functional status and demonstrates the ability to make significant improvements in function in a reasonable and predictable amount of time.     Precautions / Restrictions Precautions Precautions: Fall Precaution Comments: watch O2, HR and RR Restrictions Weight Bearing Restrictions Per Provider Order: No      Mobility  Bed Mobility Overal bed mobility: Modified Independent             General bed mobility comments: pt sits in bed with HOB elevated all the way  due to breathing difficulty    Transfers Overall transfer level: Needs assistance Equipment used: None Transfers: Sit to/from Stand Sit to Stand: Contact guard assist           General transfer comment: slow, guarded, SOB, braces self with UEs    Ambulation/Gait Ambulation/Gait assistance: Contact guard assist Gait Distance (Feet): 60 Feet Assistive device: Rolling walker (2 wheels) Gait Pattern/deviations: Step-through pattern, Decreased stride length, Trunk flexed Gait velocity: decreased Gait velocity interpretation: <1.31 ft/sec, indicative of household ambulator   General Gait Details: pt with RR in 40s, HR 120s-138bpm, and SPO2 at 93% on 1LO2 via [place], decreased to 88% on RA s/p 15' of ambulation, frequent standing rest breaks, frequently bending over onto RW due to fatigue  Stairs            Wheelchair Mobility     Tilt Bed    Modified Rankin (Stroke Patients Only)       Balance Overall balance assessment: Mild deficits observed, not formally tested                                           Pertinent Vitals/Pain Pain Assessment Pain Assessment: No/denies pain    Home Living Family/patient expects to be discharged to:: Private residence Living Arrangements: Parent (mom) Available Help at Discharge: Family;Available 24 hours/day Type of Home: Apartment Home Access: Level entry       Home  Layout: One level Home Equipment: None Additional Comments: pt has nebulizer machine; does not wear O2 at home    Prior Function Prior Level of Function : Independent/Modified Independent;Driving;Working/employed             Mobility Comments: no AD use ADLs Comments: ind; works as an Print production planner at [employer]     Extremity/Trunk Assessment   Upper Extremity Assessment Upper Extremity Assessment: Overall WFL for tasks assessed    Lower Extremity Assessment Lower Extremity Assessment: Overall WFL for tasks assessed    Cervical /  Trunk Assessment Cervical / Trunk Assessment: Other exceptions Cervical / Trunk Exceptions: able to stand upright, but keeps trunk flexed due to SOB  Communication   Communication Communication: No apparent difficulties  Cognition Arousal: Alert Behavior During Therapy: WFL for tasks assessed/performed Overall Cognitive Status: Within Functional Limits for tasks assessed                                          General Comments General comments (skin integrity, edema, etc.): SpO2 dec to 88% on RA, 93% on 1LO2, RR in 40s, HR 120s-138bpm during amb    Exercises     Assessment/Plan    PT Assessment Patient needs continued PT services  PT Problem List Decreased activity tolerance;Decreased balance;Cardiopulmonary status limiting activity       PT Treatment Interventions DME instruction;Gait training;Functional mobility training;Therapeutic activities;Therapeutic exercise;Balance training;Stair training    PT Goals (Current goals can be found in the Care Plan section)  Acute Rehab PT Goals Patient Stated Goal: improve breathing PT Goal Formulation: With patient Time For Goal Achievement: 06/20/23 Potential to Achieve Goals: Good    Frequency Min 1X/week     Co-evaluation               AM-PAC PT "6 Clicks" Mobility  Outcome Measure Help needed turning from your back to your side while in a flat bed without using bedrails?: None Help needed moving from lying on your back to sitting on the side of a flat bed without using bedrails?: None Help needed moving to and from a bed to a chair (including a wheelchair)?: None Help needed standing up from a chair using your arms (e.g., wheelchair or bedside chair)?: A Little Help needed to walk in hospital room?: A Little Help needed climbing 3-5 steps with a railing? : A Little 6 Click Score: 21    End of Session Equipment Utilized During Treatment: Gait belt;Oxygen Activity Tolerance: Patient limited by  fatigue Patient left: in bed;with call bell/phone within reach;with family/visitor present (sitting EOB) Nurse Communication: Mobility status (vital signs, drop in SpO2) PT Visit Diagnosis: Difficulty in walking, not elsewhere classified (R26.2)    Time: 1025-1100 PT Time Calculation (min) (ACUTE ONLY): 35 min   Charges:   PT Evaluation $PT Eval Low Complexity: 1 Low PT Treatments $Gait Training: 8-22 mins PT General Charges $$ ACUTE PT VISIT: 1 Visit         Lewis Shock, PT, DPT Acute Rehabilitation Services Secure chat preferred Office #: [phone]   Heather Harding 06/06/2023, 12:58 PM

## 2023-06-06 NOTE — Progress Notes (Signed)
Pt  woke up  coughing and having  difficulty breathing. Pt started to have wheezing. PRN Duoneb and robitussin given. Felt better after. Still on 1L O2.  Plan of care ongoing.

## 2023-06-07 ENCOUNTER — Other Ambulatory Visit (HOSPITAL_COMMUNITY): Payer: Self-pay

## 2023-06-07 DIAGNOSIS — J9621 Acute and chronic respiratory failure with hypoxia: Secondary | ICD-10-CM | POA: Diagnosis not present

## 2023-06-07 LAB — BASIC METABOLIC PANEL
Anion gap: 12 (ref 5–15)
BUN: 12 mg/dL (ref 6–20)
CO2: 25 mmol/L (ref 22–32)
Calcium: 9.3 mg/dL (ref 8.9–10.3)
Chloride: 103 mmol/L (ref 98–111)
Creatinine, Ser: 0.95 mg/dL (ref 0.44–1.00)
GFR, Estimated: >60 mL/min (ref >60)
Glucose, Bld: 105 mg/dL — ABNORMAL HIGH (ref 70–99)
Potassium: 4.1 mmol/L (ref 3.5–5.1)
Sodium: 140 mmol/L (ref 135–145)

## 2023-06-07 MED ORDER — AZITHROMYCIN 500 MG PO TABS
500.0000 mg | ORAL_TABLET | Freq: Every day | ORAL | 0 refills | Status: AC
  Filled 2023-06-07: qty 4, 4d supply, fill #0

## 2023-06-07 MED ORDER — MOMETASONE FURO-FORMOTEROL FUM 200-5 MCG/ACT IN AERO
2.0000 | INHALATION_SPRAY | Freq: Two times a day (BID) | RESPIRATORY_TRACT | Status: DC
  Filled 2023-06-07: qty 8.8

## 2023-06-07 MED ORDER — OSELTAMIVIR PHOSPHATE 75 MG PO CAPS
75.0000 mg | ORAL_CAPSULE | Freq: Two times a day (BID) | ORAL | 0 refills | Status: AC
  Filled 2023-06-07: qty 2, 1d supply, fill #0

## 2023-06-07 MED ORDER — GUAIFENESIN-DM 100-10 MG/5ML PO SYRP
5.0000 mL | ORAL_SOLUTION | ORAL | 0 refills | Status: AC | PRN
  Filled 2023-06-07: qty 118, 4d supply, fill #0

## 2023-06-07 MED ORDER — PREDNISONE 50 MG PO TABS
50.0000 mg | ORAL_TABLET | Freq: Every day | ORAL | 0 refills | Status: AC
  Filled 2023-06-07: qty 4, 4d supply, fill #0

## 2023-06-07 NOTE — Progress Notes (Signed)
Patient needs BSC due to being confined to one room.

## 2023-06-07 NOTE — Progress Notes (Signed)
Nurse requested Mobility Specialist to perform oxygen saturation test with pt which includes removing pt from oxygen both at rest and while ambulating.  Below are the results from that testing.     Patient Saturations on Room Air at Rest = spO2 96%  Patient Saturations on Room Air while Ambulating = sp02 92% .    Patient Saturations on N/A Liters of oxygen while Ambulating.  At end of testing pt left in room on 1  Liters of oxygen.  Reported results to nurse.    Caesar Bookman Mobility Specialist Please contact via SecureChat or [employer] [phone]

## 2023-06-07 NOTE — Progress Notes (Signed)
Mobility Specialist Progress Note;   06/07/23 1002  Mobility  Activity Ambulated with assistance in hallway  Level of Assistance Standby assist, set-up cues, supervision of patient - no hands on  Assistive Device Four wheel walker  Distance Ambulated (ft) 150 ft  Activity Response Tolerated well  Mobility Referral Yes  Mobility visit 1 Mobility  Mobility Specialist Start Time (ACUTE ONLY) 1002  Mobility Specialist Stop Time (ACUTE ONLY) 1025  Mobility Specialist Time Calculation (min) (ACUTE ONLY) 23 min   Pt agreeable to mobility. Required no physical assistance throughout ambulation, SV. Ambulated on 1LO2, VSS throughout. Displayed SOB towards EOS. Pt returned back to EOB with all needs met, on 2LO2. Mother in room.   Caesar Bookman Mobility Specialist Please contact via SecureChat or [employer] [phone]

## 2023-06-07 NOTE — Progress Notes (Signed)
Went over AVS with patient, answered any/all questions, ivs removed, patient belongings gathered, Home 02/rollator/BSC delivered to room and taken with patient, doctors note given. Wheeled out to family vehicle.

## 2023-06-07 NOTE — Progress Notes (Signed)
SATURATION QUALIFICATIONS: (This note is used to comply with regulatory documentation for home oxygen)  Patient Saturations on Room Air at Rest = 97%  Patient Saturations on Room Air while Ambulating = 93%  Saturations on [place] not done due to 93% on room air.

## 2023-06-07 NOTE — Progress Notes (Signed)
Occupational Therapy Treatment Patient Details Name: Heather Harding MRN: [id number] DOB: 03/06/1976 Today's Date: 06/07/2023   History of present illness 48 y/o F presenting to ED On 1/26 with SOB and cough SpO2 in the 60s. Admitted for acute hypoxic resp failure 2/2 influenza.    PMH Includes asthma, GERD, hypoplastic lung, congenital hypoplastic pulmonary artery   OT comments  Patient with good progress toward patient focused goals.  Patient on 1L this session, able to walk around the room with better posture and improved activity tolerance.  Performing ADL from a supervised level, with increased time and rest breaks.  Patient is certainly not at her baseline, and continues to have elevated RR in the 40's, and HR above 100 BPM with simple mobility and standing tasks.  OT will continue efforts in the acute setting to address deficits, but no post acute OT is anticipated.        If plan is discharge home, recommend the following:  A little help with walking and/or transfers;A little help with bathing/dressing/bathroom;Assistance with cooking/housework;Assist for transportation   Equipment Recommendations  Tub/shower seat    Recommendations for Other Services      Precautions / Restrictions Precautions Precaution Comments: watch O2, HR and RR Restrictions Weight Bearing Restrictions Per Provider Order: No       Mobility Bed Mobility Overal bed mobility: Modified Independent               Patient Response: Cooperative  Transfers Overall transfer level: Needs assistance Equipment used: None Transfers: Sit to/from Stand Sit to Stand: Modified independent (Device/Increase time)                 Balance Overall balance assessment: Mild deficits observed, not formally tested                                         ADL either performed or assessed with clinical judgement   ADL       Grooming: Standing;Wash/dry face;Oral care;Set up                Lower Body Dressing: Supervision/safety;Sit to/from stand   Toilet Transfer: Supervision/safety;Ambulation;BSC/3in1                  Extremity/Trunk Assessment Upper Extremity Assessment Upper Extremity Assessment: Overall WFL for tasks assessed   Lower Extremity Assessment Lower Extremity Assessment: Defer to PT evaluation        Vision Patient Visual Report: No change from baseline     Perception Perception Perception: Not tested   Praxis Praxis Praxis: Not tested    Cognition Arousal: Alert Behavior During Therapy: [facility] for tasks assessed/performed Overall Cognitive Status: Within Functional Limits for tasks assessed                                                             Pertinent Vitals/ Pain       Pain Assessment Pain Assessment: No/denies pain  Frequency  Min 1X/week        Progress Toward Goals  OT Goals(current goals can now be found in the care plan section)  Progress towards OT goals: Progressing toward goals  Acute Rehab OT Goals OT Goal Formulation: With patient Time For Goal Achievement: 06/19/23 Potential to Achieve Goals: Good  Plan      Co-evaluation                 AM-PAC OT "6 Clicks" Daily Activity     Outcome Measure   Help from another person eating meals?: None Help from another person taking care of personal grooming?: None Help from another person toileting, which includes using toliet, bedpan, or urinal?: A Little Help from another person bathing (including washing, rinsing, drying)?: A Little Help from another person to put on and taking off regular upper body clothing?: None Help from another person to put on and taking off regular lower body clothing?: A Little 6 Click Score: 21    End of Session Equipment Utilized During Treatment: Oxygen  OT Visit Diagnosis: Unsteadiness on feet  (R26.81);Other abnormalities of gait and mobility (R26.89);Muscle weakness (generalized) (M62.81)   Activity Tolerance Patient tolerated treatment well   Patient Left in bed;with call bell/phone within reach;with family/visitor present   Nurse Communication Mobility status        Time: 1140-1200 OT Time Calculation (min): 20 min  Charges: OT General Charges $OT Visit: 1 Visit OT Treatments $Self Care/Home Management : 8-22 mins  06/07/2023  RP, OTR/L  Acute Rehabilitation Services  Office:  [phone]   Suzanna Obey 06/07/2023, 12:10 PM

## 2023-06-07 NOTE — Progress Notes (Signed)
SATURATION QUALIFICATIONS: (This note is used to comply with regulatory documentation for home oxygen)  Patient Saturations on Room Air at Rest = 94%  Patient Saturations on Room Air while Ambulating = 92%  Patient Saturations on 2 Liters of oxygen while Ambulating = 96%  Please briefly explain why patient needs home oxygen:  Patient is visibly short of breath with ambulating short distances.  She needs frequent breaks.  HR maintains 115-125 with ambulation, at rest HR is 90s.

## 2023-06-07 NOTE — TOC Progression Note (Addendum)
Transition of Care [facility]) - Progression Note    Patient Details  Name: Heather Harding MRN: [id number] Date of Birth: November 16, 1975  [facility] CM/SW Contact  Harriet Masson, RN Phone Number: 06/07/2023, 4:29 PM  Clinical Narrative:    Patient does not want to rollator and BSC that [employer] brought due to being to small and requested this RNCM order from another company. Notified Zack with [employer] of DME needs.  06/07/2023 1630: Patient does not qualify for insurance to cover home 02 but is agreeable to pay out of pocket.  Mitch with [employer] notified.    Barriers to Discharge: Continued Medical Work up  Expected Discharge Plan and Services In-house Referral: Clinical Social Work     Living arrangements for the past 2 months: Apartment Expected Discharge Date: 06/07/23                                     Social Determinants of Health (SDOH) Interventions SDOH Screenings   Food Insecurity: No Food Insecurity (06/04/2023)  Housing: Low Risk  (06/04/2023)  Transportation Needs: No Transportation Needs (06/04/2023)  Utilities: At Risk (06/04/2023)  Depression (PHQ2-9): Low Risk  (10/19/2020)  Tobacco Use: Low Risk  (06/03/2023)    Readmission Risk Interventions     No data to display

## 2023-06-07 NOTE — Plan of Care (Signed)
Problem: Clinical Measurements: Goal: Will remain free from infection Outcome: Progressing Goal: Respiratory complications will improve Outcome: Progressing   Problem: Activity: Goal: Risk for activity intolerance will decrease Outcome: Progressing   Problem: Nutrition: Goal: Adequate nutrition will be maintained Outcome: Progressing   Problem: Coping: Goal: Level of anxiety will decrease Outcome: Progressing

## 2023-06-07 NOTE — Discharge Summary (Signed)
Physician Discharge Summary  Heather Harding NTI:[id number] DOB: 19-Dec-1975 DOA: 06/03/2023  PCP: Joylene Igo, MD  Admit date: 06/03/2023 Discharge date: 06/07/2023 30 Day Unplanned Readmission Risk Score    Flowsheet Row ED to Hosp-Admission (Current) from 06/03/2023 in [place] 2C CV PROGRESSIVE CARE  30 Day Unplanned Readmission Risk Score (%) 11.65 Filed at 06/07/2023 1200       This score is the patient's risk of an unplanned readmission within 30 days of being discharged (0 -100%). The score is based on dignosis, age, lab data, medications, orders, and past utilization.   Low:  0-14.9   Medium: 15-21.9   High: 22-29.9   Extreme: 30 and above          Admitted From: Home Disposition: Home  Recommendations for Outpatient Follow-up:  Follow up with PCP in 1-2 weeks Please obtain BMP/CBC in one week Please follow up with your PCP on the following pending results: Unresulted Labs (From admission, onward)     Start     Ordered   06/10/23 0500  Creatinine, serum  (enoxaparin (LOVENOX)    CrCl >/= 30 ml/min)  Weekly,   R     Comments: while on enoxaparin therapy    06/03/23 2131              Home Health: None Equipment/Devices: Bedside commode and rollator  Discharge Condition: Stable CODE STATUS: Full code Diet recommendation: Cardiac  Subjective: Seen and examined, feels much better.  She feels comfortable going home today and her mother who is at the bedside also agrees with her.  She requested a work Physicist, medical which she has been provided with.  Brief/Interim Summary:  Heather Harding is a 49 y.o. female with medical history significant of chronic persistent asthma, GERD, hypoplastic lung, congenital hypoplastic pulmonary artery who was exposed to the flu and came to the ER with significant shortness of breath and cough of 1 day duration.  Patient was brought in with oxygen sats in the 60s.  She tried her inhalers and breathing treatments at home.  Symptoms  continued to escalate and get worse.  EMS were called where she get 5 mg of albuterol as well as continuous breathing treatment in the ER but still very hypoxic.  She was admitted under hospitalist service with following issues, details below.  Severe sepsis and acute hypoxic respiratory failure secondary to influenza A infection/acute asthma exacerbation, POA: Patient met criteria for severe sepsis based on tachycardia, tachypnea and acute hypoxic respiratory failure requiring BiPAP.  She received 1 L of IV fluid bolus in the ED. lactic acid normal.  Patient clinically appears to have improved significantly, we did ambulatory oximetry, she was saturating over 90% on room air.  Wheezing has improved as well.  She was treated with IV Solu-Medrol, bronchodilators and azithromycin during this hospitalization.  She is stable for discharge and she feels comfortable going home.  Due to hypoplastic lung, will treat with extended steroids and antibiotics and will discharge on 4 more days of both of these.     GERD: PPI   Anemia of chronic disease: Hemoglobin stable.  Monitor.   Congenital right lung hypoplasia: Noted.   Morbid obesity class III: Weight loss and diet modification counseled.  Discharge plan was discussed with patient and/or family member and they verbalized understanding and agreed with it.  Discharge Diagnoses:  Principal Problem:   Acute on chronic respiratory failure with hypoxemia (HCC) Active Problems:   ANEMIA, IRON DEFICIENCY, UNSPEC.  Asthma   GASTROESOPHAGEAL REFLUX, NO ESOPHAGITIS   Hypoplasia of right lung   Morbid obesity (HCC)   Right pulmonary artery hypoplasia   Severe sepsis [facility])    Discharge Instructions   Allergies as of 06/07/2023       Reactions   Aspirin Other (See Comments)   Other Reaction: RYE   Iodine Other (See Comments)   [facility] stated she had an allergy as a child. Recommended non-contrast radiology exams   Morphine Swelling   Nsaids  Other (See Comments)   Caused Rye Syndrome   Ranitidine Anaphylaxis, Other (See Comments)   Stopped breathing   Advair Hfa [fluticasone-salmeterol] Other (See Comments)   Dried out lungs too much   Codeine Swelling, Rash   Morphine And Codeine Swelling, Rash        Medication List     TAKE these medications    acetaminophen 500 MG tablet Commonly known as: TYLENOL Take 1,000 mg by mouth every 6 (six) hours as needed for fever, headache or mild pain (pain score 1-3) (WIll take 3 tablets if it is a migraine).   albuterol 108 (90 Base) MCG/ACT inhaler Commonly known as: VENTOLIN HFA Inhale 1-2 puffs into the lungs every 6 (six) hours as needed for wheezing or shortness of breath.   azithromycin 500 MG tablet Commonly known as: ZITHROMAX Take 1 tablet (500 mg total) by mouth daily for 4 days.   budesonide-formoterol 160-4.5 MCG/ACT inhaler Commonly known as: SYMBICORT Inhale 2 puffs into the lungs daily.   Cetirizine HCl 10 MG Caps Commonly known as: ZyrTEC Allergy Take 1 capsule (10 mg total) by mouth daily.   cetirizine-pseudoephedrine 5-120 MG tablet Commonly known as: ZYRTEC-D Take 1 tablet by mouth 2 (two) times daily.   cholecalciferol 10 MCG (400 UNIT) Tabs tablet Commonly known as: VITAMIN D3 Take 400 Units by mouth daily.   guaiFENesin-dextromethorphan 100-10 MG/5ML syrup Commonly known as: ROBITUSSIN DM Take 5 mLs by mouth every 4 (four) hours as needed for cough.   ipratropium-albuterol 0.5-2.5 (3) MG/3ML Soln Commonly known as: DUONEB Inhale 3 mLs into the lungs.   magnesium oxide 400 MG tablet Commonly known as: MAG-OX Take 200 mg by mouth daily.   omeprazole 20 MG capsule Commonly known as: PRILOSEC Take 20 mg by mouth daily.   oseltamivir 75 MG capsule Commonly known as: TAMIFLU Take 1 capsule (75 mg total) by mouth 2 (two) times daily for 2 doses.   predniSONE 50 MG tablet Commonly known as: DELTASONE Take 1 tablet (50 mg total) by mouth  daily with breakfast for 4 days.   VITAMIN B12 PO Take 1 tablet by mouth daily at 12 noon.               Durable Medical Equipment  (From admission, onward)           Start     Ordered   06/07/23 1334  For home use only DME 4 wheeled rolling walker with seat  Once       Question:  Patient needs a walker to treat with the following condition  Answer:  Weakness   06/07/23 1333   06/07/23 1334  For home use only DME Bedside commode  Once       Question:  Patient needs a bedside commode to treat with the following condition  Answer:  Weakness   06/07/23 1333            Follow-up Information     Connect with your  PCP/Specialist as discussed. Schedule an appointment as soon as possible for a visit .   Contact information: [email or web address] Call our physician referral line at [phone].        Joylene Igo, MD Follow up in 1 week(s).   Specialty: Internal Medicine               Allergies  Allergen Reactions   Aspirin Other (See Comments)    Other Reaction: RYE   Iodine Other (See Comments)    [facility] stated she had an allergy as a child. Recommended non-contrast radiology exams   Morphine Swelling   Nsaids Other (See Comments)    Caused Rye Syndrome   Ranitidine Anaphylaxis and Other (See Comments)    Stopped breathing   Advair Hfa [Fluticasone-Salmeterol] Other (See Comments)    Dried out lungs too much   Codeine Swelling and Rash   Morphine And Codeine Swelling and Rash    Consultations: None   Procedures/Studies: DG Chest Portable 1 View Result Date: 06/03/2023 CLINICAL DATA:  Shortness of breath hand respiratory distress. Audible wheezing. Accessory muscle use. History of congenital hypoplasia of the right lung. EXAM: PORTABLE CHEST 1 VIEW COMPARISON:  Chest radiographs 07/23/2022 and 05/27/2021; CT chest 06/13/2019 FINDINGS: There is again rightward mediastinal shift, reportedly secondary to right lung  congenital hypoplasia. The cardiac silhouette and mediastinal contours appear within normal limits. The left lung is clear. There is subtle veiled density overlying the inferior right hemithorax which is favored to be secondary to the projection, low lung volumes, and overlying epicardial fat pad. No definite pleural effusion. No pneumothorax. Mild levocurvature of the upper thoracic spine and moderate multilevel degenerative disc changes. IMPRESSION: 1. Unchanged chronic rightward mediastinal shift, reportedly secondary to right lung congenital hypoplasia. 2. No definite acute cardiopulmonary process. Electronically Signed   By: Neita Garnet M.D.   On: 06/03/2023 17:34     Discharge Exam: Vitals:   06/07/23 0708 06/07/23 0801  BP:  (!) 152/104  Pulse:  99  Resp:  17  Temp:  98.2 F (36.8 C)  SpO2: 95% 93%   Vitals:   06/07/23 0400 06/07/23 0600 06/07/23 0708 06/07/23 0801  BP:    (!) 152/104  Pulse: 83 66  99  Resp: 20 18  17   Temp:    98.2 F (36.8 C)  TempSrc:    Oral  SpO2: 94% 98% 95% 93%  Weight:      Height:        General: Pt is alert, awake, not in acute distress Cardiovascular: RRR, S1/S2 +, no rubs, no gallops Respiratory: CTA bilaterally, no wheezing, no rhonchi Abdominal: Soft, NT, ND, bowel sounds + Extremities: no edema, no cyanosis    The results of significant diagnostics from this hospitalization (including imaging, microbiology, ancillary and laboratory) are listed below for reference.     Microbiology: Recent Results (from the past 240 hours)  Resp panel by RT-PCR (RSV, Flu A&B, Covid) Anterior Nasal Swab     Status: Abnormal   Collection Time: 06/03/23  5:42 PM   Specimen: Anterior Nasal Swab  Result Value Ref Range Status   SARS Coronavirus 2 by RT PCR NEGATIVE NEGATIVE Final   Influenza A by PCR POSITIVE (A) NEGATIVE Final   Influenza B by PCR NEGATIVE NEGATIVE Final    Comment: (NOTE) The Xpert Xpress SARS-CoV-2/FLU/RSV plus assay is intended  as an aid in the diagnosis of influenza from Nasopharyngeal swab specimens and should not be used as a sole basis  for treatment. Nasal washings and aspirates are unacceptable for Xpert Xpress SARS-CoV-2/FLU/RSV testing.  Fact Sheet for Patients: BloggerCourse.com  Fact Sheet for Healthcare Providers: SeriousBroker.it  This test is not yet approved or cleared by the [place] FDA and has been authorized for detection and/or diagnosis of SARS-CoV-2 by FDA under an Emergency Use Authorization (EUA). This EUA will remain in effect (meaning this test can be used) for the duration of the COVID-19 declaration under Section 564(b)(1) of the Act, 21 U.S.C. section 360bbb-3(b)(1), unless the authorization is terminated or revoked.     Resp Syncytial Virus by PCR NEGATIVE NEGATIVE Final    Comment: (NOTE) Fact Sheet for Patients: BloggerCourse.com  Fact Sheet for Healthcare Providers: SeriousBroker.it  This test is not yet approved or cleared by the [place] FDA and has been authorized for detection and/or diagnosis of SARS-CoV-2 by FDA under an Emergency Use Authorization (EUA). This EUA will remain in effect (meaning this test can be used) for the duration of the COVID-19 declaration under Section 564(b)(1) of the Act, 21 U.S.C. section 360bbb-3(b)(1), unless the authorization is terminated or revoked.  Performed at [facility], [address] [place], [place] [address]   MRSA Next Gen by PCR, Nasal     Status: None   Collection Time: 06/04/23  7:07 PM   Specimen: Nasal Mucosa; Nasal Swab  Result Value Ref Range Status   MRSA by PCR Next Gen NOT DETECTED NOT DETECTED Final    Comment: (NOTE) The GeneXpert MRSA Assay (FDA approved for NASAL specimens only), is one component of a comprehensive MRSA colonization surveillance program. It is not intended to diagnose  MRSA infection nor to guide or monitor treatment for MRSA infections. Test performance is not FDA approved in patients less than 48 years old. Performed at [facility], [address], [place], [place] [address]   Respiratory (~20 pathogens) panel by PCR     Status: Abnormal   Collection Time: 06/05/23 11:10 AM   Specimen: Nasopharyngeal Swab; Respiratory  Result Value Ref Range Status   Adenovirus NOT DETECTED NOT DETECTED Final   Coronavirus 229E NOT DETECTED NOT DETECTED Final    Comment: (NOTE) The Coronavirus on the Respiratory Panel, DOES NOT test for the novel  Coronavirus (2019 nCoV)    Coronavirus HKU1 NOT DETECTED NOT DETECTED Final   Coronavirus NL63 NOT DETECTED NOT DETECTED Final   Coronavirus OC43 NOT DETECTED NOT DETECTED Final   Metapneumovirus NOT DETECTED NOT DETECTED Final   Rhinovirus / Enterovirus NOT DETECTED NOT DETECTED Final   Influenza A H1 2009 DETECTED (A) NOT DETECTED Final   Influenza B NOT DETECTED NOT DETECTED Final   Parainfluenza Virus 1 NOT DETECTED NOT DETECTED Final   Parainfluenza Virus 2 NOT DETECTED NOT DETECTED Final   Parainfluenza Virus 3 NOT DETECTED NOT DETECTED Final   Parainfluenza Virus 4 NOT DETECTED NOT DETECTED Final   Respiratory Syncytial Virus NOT DETECTED NOT DETECTED Final   Bordetella pertussis NOT DETECTED NOT DETECTED Final   Bordetella Parapertussis NOT DETECTED NOT DETECTED Final   Chlamydophila pneumoniae NOT DETECTED NOT DETECTED Final   Mycoplasma pneumoniae NOT DETECTED NOT DETECTED Final    Comment: Performed at [facility], [address] [place], [place] [address]     Labs: BNP (last 3 results) No results for input(s): "BNP" in the last 8760 hours. Basic Metabolic Panel: Recent Labs  Lab 06/03/23 1708 06/04/23 0404 06/05/23 0841 06/07/23 1110  NA 140 136 139  140  K 4.4 4.2 4.4 4.1  CL 103 102 105 103  CO2 25 21* 25 25  GLUCOSE 129* 152* 133* 105*  BUN 5* 6 10 12   CREATININE 1.00 1.03*  0.90 0.95  CALCIUM 9.4 8.7* 9.4 9.3   Liver Function Tests: Recent Labs  Lab 06/03/23 1708 06/04/23 0404  AST 37 29  ALT 26 22  ALKPHOS 70 65  BILITOT 0.6 0.5  PROT 7.9 7.6  ALBUMIN 3.7 3.5   No results for input(s): "LIPASE", "AMYLASE" in the last 168 hours. No results for input(s): "AMMONIA" in the last 168 hours. CBC: Recent Labs  Lab 06/03/23 1708 06/04/23 0404  WBC 9.4 8.2  NEUTROABS 8.1*  --   HGB 12.4 12.1  HCT 40.0 38.9  MCV 77.5* 78.1*  PLT 284 259   Cardiac Enzymes: No results for input(s): "CKTOTAL", "CKMB", "CKMBINDEX", "TROPONINI" in the last 168 hours. BNP: Invalid input(s): "POCBNP" CBG: No results for input(s): "GLUCAP" in the last 168 hours. D-Dimer No results for input(s): "DDIMER" in the last 72 hours. Hgb A1c No results for input(s): "HGBA1C" in the last 72 hours. Lipid Profile No results for input(s): "CHOL", "HDL", "LDLCALC", "TRIG", "CHOLHDL", "LDLDIRECT" in the last 72 hours. Thyroid function studies No results for input(s): "TSH", "T4TOTAL", "T3FREE", "THYROIDAB" in the last 72 hours.  Invalid input(s): "FREET3" Anemia work up No results for input(s): "VITAMINB12", "FOLATE", "FERRITIN", "TIBC", "IRON", "RETICCTPCT" in the last 72 hours. Urinalysis    Component Value Date/Time   BILIRUBINUR negative 02/18/2021 1610   KETONESUR small (15) (A) 02/18/2021 0922   PROTEINUR trace (A) 02/18/2021 0922   UROBILINOGEN 0.2 02/18/2021 0922   NITRITE Negative 02/18/2021 0922   LEUKOCYTESUR Negative 02/18/2021 0922   Sepsis Labs Recent Labs  Lab 06/03/23 1708 06/04/23 0404  WBC 9.4 8.2   Microbiology Recent Results (from the past 240 hours)  Resp panel by RT-PCR (RSV, Flu A&B, Covid) Anterior Nasal Swab     Status: Abnormal   Collection Time: 06/03/23  5:42 PM   Specimen: Anterior Nasal Swab  Result Value Ref Range Status   SARS Coronavirus 2 by RT PCR NEGATIVE NEGATIVE Final   Influenza A by PCR POSITIVE (A) NEGATIVE Final    Influenza B by PCR NEGATIVE NEGATIVE Final    Comment: (NOTE) The Xpert Xpress SARS-CoV-2/FLU/RSV plus assay is intended as an aid in the diagnosis of influenza from Nasopharyngeal swab specimens and should not be used as a sole basis for treatment. Nasal washings and aspirates are unacceptable for Xpert Xpress SARS-CoV-2/FLU/RSV testing.  Fact Sheet for Patients: BloggerCourse.com  Fact Sheet for Healthcare Providers: SeriousBroker.it  This test is not yet approved or cleared by the [place] FDA and has been authorized for detection and/or diagnosis of SARS-CoV-2 by FDA under an Emergency Use Authorization (EUA). This EUA will remain in effect (meaning this test can be used) for the duration of the COVID-19 declaration under Section 564(b)(1) of the Act, 21 U.S.C. section 360bbb-3(b)(1), unless the authorization is terminated or revoked.     Resp Syncytial Virus by PCR NEGATIVE NEGATIVE Final    Comment: (NOTE) Fact Sheet for Patients: BloggerCourse.com  Fact Sheet for Healthcare Providers: SeriousBroker.it  This test is not yet approved or cleared by the [place] FDA and has been authorized for detection and/or diagnosis of SARS-CoV-2 by FDA under an Emergency Use Authorization (EUA). This EUA will remain in effect (meaning this test can be used) for the duration of the COVID-19 declaration under Section  564(b)(1) of the Act, 21 U.S.C. section 360bbb-3(b)(1), unless the authorization is terminated or revoked.  Performed at [facility], [address], [place], [place] [address]   MRSA Next Gen by PCR, Nasal     Status: None   Collection Time: 06/04/23  7:07 PM   Specimen: Nasal Mucosa; Nasal Swab  Result Value Ref Range Status   MRSA by PCR Next Gen NOT DETECTED NOT DETECTED Final    Comment: (NOTE) The GeneXpert MRSA Assay (FDA approved for NASAL  specimens only), is one component of a comprehensive MRSA colonization surveillance program. It is not intended to diagnose MRSA infection nor to guide or monitor treatment for MRSA infections. Test performance is not FDA approved in patients less than 42 years old. Performed at [facility], [address], [place], [place] [address]   Respiratory (~20 pathogens) panel by PCR     Status: Abnormal   Collection Time: 06/05/23 11:10 AM   Specimen: Nasopharyngeal Swab; Respiratory  Result Value Ref Range Status   Adenovirus NOT DETECTED NOT DETECTED Final   Coronavirus 229E NOT DETECTED NOT DETECTED Final    Comment: (NOTE) The Coronavirus on the Respiratory Panel, DOES NOT test for the novel  Coronavirus (2019 nCoV)    Coronavirus HKU1 NOT DETECTED NOT DETECTED Final   Coronavirus NL63 NOT DETECTED NOT DETECTED Final   Coronavirus OC43 NOT DETECTED NOT DETECTED Final   Metapneumovirus NOT DETECTED NOT DETECTED Final   Rhinovirus / Enterovirus NOT DETECTED NOT DETECTED Final   Influenza A H1 2009 DETECTED (A) NOT DETECTED Final   Influenza B NOT DETECTED NOT DETECTED Final   Parainfluenza Virus 1 NOT DETECTED NOT DETECTED Final   Parainfluenza Virus 2 NOT DETECTED NOT DETECTED Final   Parainfluenza Virus 3 NOT DETECTED NOT DETECTED Final   Parainfluenza Virus 4 NOT DETECTED NOT DETECTED Final   Respiratory Syncytial Virus NOT DETECTED NOT DETECTED Final   Bordetella pertussis NOT DETECTED NOT DETECTED Final   Bordetella Parapertussis NOT DETECTED NOT DETECTED Final   Chlamydophila pneumoniae NOT DETECTED NOT DETECTED Final   Mycoplasma pneumoniae NOT DETECTED NOT DETECTED Final    Comment: Performed at [facility], [address] [place], [place] [address]    FURTHER DISCHARGE INSTRUCTIONS:   Get Medicines reviewed and adjusted: Please take all your medications with you for your next visit with your Primary MD   Laboratory/radiological data: Please request your  Primary MD to go over all hospital tests and procedure/radiological results at the follow up, please ask your Primary MD to get all Hospital records sent to his/her office.   In some cases, they will be blood work, cultures and biopsy results pending at the time of your discharge. Please request that your primary care M.D. goes through all the records of your hospital data and follows up on these results.   Also Note the following: If you experience worsening of your admission symptoms, develop shortness of breath, life threatening emergency, suicidal or homicidal thoughts you must seek medical attention immediately by calling 911 or calling your MD immediately  if symptoms less severe.   You must read complete instructions/literature along with all the possible adverse reactions/side effects for all the Medicines you take and that have been prescribed to you. Take any new Medicines after you have completely understood and accpet all the possible adverse reactions/side effects.    Do not drive when taking Pain medications or sleeping medications (Benzodaizepines)   Do not  take more than prescribed Pain, Sleep and Anxiety Medications. It is not advisable to combine anxiety,sleep and pain medications without talking with your primary care practitioner   Special Instructions: If you have smoked or chewed Tobacco  in the last 2 yrs please stop smoking, stop any regular Alcohol  and or any Recreational drug use.   Wear Seat belts while driving.   Please note: You were cared for by a hospitalist during your hospital stay. Once you are discharged, your primary care physician will handle any further medical issues. Please note that NO REFILLS for any discharge medications will be authorized once you are discharged, as it is imperative that you return to your primary care physician (or establish a relationship with a primary care physician if you do not have one) for your post hospital discharge needs so  that they can reassess your need for medications and monitor your lab values  Time coordinating discharge: Over 30 minutes  SIGNED:   Hughie Closs, MD  [facility] 06/07/2023, 1:35 PM *Please note that this is a verbal dictation therefore any spelling or grammatical errors are due to the "Dragon Medical One" system interpretation. If 7PM-7AM, please contact night-coverage [email or web address]

## 2023-07-16 ENCOUNTER — Encounter: Payer: Self-pay | Admitting: Emergency Medicine

## 2023-07-16 ENCOUNTER — Ambulatory Visit
Admission: EM | Admit: 2023-07-16 | Discharge: 2023-07-16 | Disposition: A | Discharge status: Home / Self Care | Attending: Emergency Medicine | Admitting: Emergency Medicine

## 2023-07-16 ENCOUNTER — Other Ambulatory Visit: Payer: Self-pay

## 2023-07-16 DIAGNOSIS — J069 Acute upper respiratory infection, unspecified: Secondary | ICD-10-CM

## 2023-07-16 LAB — POC COVID19/FLU A&B COMBO
Covid Antigen, POC: NEGATIVE
Influenza A Antigen, POC: NEGATIVE
Influenza B Antigen, POC: NEGATIVE

## 2023-07-16 MED ORDER — AZITHROMYCIN 250 MG PO TABS: 250.0000 mg | ORAL_TABLET | Freq: Every day | ORAL | 0 refills | Status: DC

## 2023-07-16 MED ORDER — BENZONATATE 100 MG PO CAPS: 100.0000 mg | ORAL_CAPSULE | Freq: Three times a day (TID) | ORAL | 0 refills | Status: AC

## 2023-07-16 MED ORDER — PREDNISONE 10 MG (21) PO TBPK: ORAL_TABLET | Freq: Every day | ORAL | 0 refills | Status: AC

## 2023-07-16 NOTE — ED Triage Notes (Signed)
 Patient presents to [facility] for evaluation of runny nose, cough, sinus drainage, wheezing and headaches x 1 week.  Reviewed need for COVID/Flu, but patient requested anyway.

## 2023-07-16 NOTE — Discharge Instructions (Addendum)
 Take azithromycin for bacterial coverage  Covid and  flu testing negative  Starting tomorrow to prednisone every morning with food as directed to open and relax airway, should help with breathing  May use Tessalon pill every 8 hours as needed for cough    You can take Tylenol and/or Ibuprofen as needed for fever reduction and pain relief.   For cough: honey 1/2 to 1 teaspoon (you can dilute the honey in water or another fluid).  You can also use guaifenesin and dextromethorphan for cough. You can use a humidifier for chest congestion and cough.  If you don't have a humidifier, you can sit in the bathroom with the hot shower running.      For sore throat: try warm salt water gargles, cepacol lozenges, throat spray, warm tea or water with lemon/honey, popsicles or ice, or OTC cold relief medicine for throat discomfort.   For congestion: take a daily anti-histamine like Zyrtec, Claritin, and a oral decongestant, such as pseudoephedrine.  You can also use Flonase 1-2 sprays in each nostril daily.   It is important to stay hydrated: drink plenty of fluids (water, gatorade/powerade/pedialyte, juices, or teas) to keep your throat moisturized and help further relieve irritation/discomfort.

## 2023-07-17 NOTE — ED Provider Notes (Signed)
 Heather Harding    CSN: [id number] Arrival date & time: 07/16/23  1854      History   Chief Complaint Chief Complaint  Patient presents with   URI    HPI Heather Harding is a 48 y.o. female.   Patient presents for evaluation of nasal congestion, rhinorrhea, nonproductive cough, shortness of breath with exertion, wheezing, intermittent headaches and sore throat present for 7 days.  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  Tolerating food and liquids.  Initial decrease in appetite has improved.  Has attempted use of Zyrtec and vitamins.  History of undeveloped right lung, asthma.  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 07/05/2006   Asthma 07/05/2006   GASTROESOPHAGEAL REFLUX, NO ESOPHAGITIS 07/05/2006    History reviewed. No pertinent surgical history.  OB History     Gravida  1   Para  0   Term      Preterm      AB  1   Living  0      SAB      IAB  1   Ectopic      Multiple      Live Births  0            Home Medications    Prior to Admission medications   Medication Sig Start Date End Date Taking? Authorizing Provider  azithromycin (ZITHROMAX) 250 MG tablet Take 1 tablet (250 mg total) by mouth daily. Take first 2 tablets together, then 1 every day until finished. 07/16/23  Yes Hayze Gazda R, NP  benzonatate (TESSALON) 100 MG capsule Take 1 capsule (100 mg total) by mouth every 8 (eight) hours. 07/16/23  Yes Samone Guhl R, NP   predniSONE (STERAPRED UNI-PAK 21 TAB) 10 MG (21) TBPK tablet Take by mouth daily. Take 6 tabs by mouth daily  for 1 days, then 5 tabs for 1 days, then 4 tabs for 1 days, then 3 tabs for 1 days, 2 tabs for 1 days, then 1 tab by mouth daily for 1 days 07/16/23  Yes Tatisha Cerino R, NP  acetaminophen (TYLENOL) 500 MG tablet Take 1,000 mg by mouth every 6 (six) hours as needed for fever, headache or mild pain (pain score 1-3) (WIll take 3 tablets if it is a migraine).    [provider]  albuterol (VENTOLIN HFA) 108 (90 Base) MCG/ACT inhaler Inhale 1-2 puffs into the lungs every 6 (six) hours as needed for wheezing or shortness of breath. 05/04/21   Rushie Chestnut, PA-C  budesonide-formoterol (SYMBICORT) 160-4.5 MCG/ACT inhaler Inhale 2 puffs into the lungs daily. 05/12/23   [provider]  Cetirizine HCl (ZYRTEC ALLERGY) 10 MG CAPS Take 1 capsule (10 mg total) by mouth daily. 08/22/17   Cathie Hoops, Amy V, PA-C  cetirizine-pseudoephedrine (ZYRTEC-D) 5-120 MG tablet Take 1 tablet by mouth 2 (two) times daily.    [provider]  cholecalciferol (VITAMIN D3) 10 MCG (400  UNIT) TABS tablet Take 400 Units by mouth daily.    [provider]  Cyanocobalamin (VITAMIN B12 PO) Take 1 tablet by mouth daily at 12 noon.    [provider]  guaiFENesin-dextromethorphan (ROBITUSSIN DM) 100-10 MG/5ML syrup Take 5 mLs by mouth every 4 (four) hours as needed for cough. 06/07/23   Hughie Closs, MD  ipratropium-albuterol (DUONEB) 0.5-2.5 (3) MG/3ML SOLN Inhale 3 mLs into the lungs. 08/25/22 08/20/23  [provider]  magnesium oxide (MAG-OX) 400 MG tablet Take 200 mg by mouth daily. 07/21/22 07/21/23  [provider]  omeprazole (PRILOSEC) 20 MG capsule Take 20 mg by mouth daily.    [provider]    Family History Family History  Problem Relation Age of Onset   Cancer Paternal Grandfather    Stroke Maternal Grandmother    Stroke Maternal Grandfather     Diabetes Father    Hypertension Sister     Social History Social History   Tobacco Use   Smoking status: Never   Smokeless tobacco: Never  Vaping Use   Vaping status: Never Used  Substance Use Topics   Alcohol use: No   Drug use: No     Allergies   Aspirin, Iodine, Morphine, Nsaids, Ranitidine, Advair hfa [fluticasone-salmeterol], Codeine, and Morphine and codeine   Review of Systems Review of Systems   Physical Exam Triage Vital Signs ED Triage Vitals [07/16/23 1926]  Encounter Vitals Group     BP (!) 148/99     Systolic BP Percentile      Diastolic BP Percentile      Pulse Rate 92     Resp 18     Temp 97.7 F (36.5 C)     Temp Source Temporal     SpO2 98 %     Weight      Height      Head Circumference      Peak Flow      Pain Score 0     Pain Loc      Pain Education      Exclude from Growth Chart    No data found.  Updated Vital Signs BP (!) 148/99 (BP Location: Left Arm)   Pulse 92   Temp 97.7 F (36.5 C) (Temporal)   Resp 18   SpO2 98%   Visual Acuity Right Eye Distance:   Left Eye Distance:   Bilateral Distance:    Right Eye Near:   Left Eye Near:    Bilateral Near:     Physical Exam Constitutional:      Appearance: Normal appearance.  HENT:     Right Ear: Tympanic membrane, ear canal and external ear normal.     Left Ear: Tympanic membrane, ear canal and external ear normal.     Nose: Congestion present.     Mouth/Throat:     Mouth: Mucous membranes are moist.     Pharynx: Oropharynx is clear. No oropharyngeal exudate or posterior oropharyngeal erythema.  Eyes:     Extraocular Movements: Extraocular movements intact.  Cardiovascular:     Rate and Rhythm: Normal rate and regular rhythm.     Pulses: Normal pulses.     Heart sounds: Normal heart sounds.  Pulmonary:     Effort: Pulmonary effort is normal.     Breath sounds: Wheezing present.  Neurological:     Mental Status: She is alert and oriented to person, place, and time.  Mental status is at baseline.  UC Treatments / Results  Labs (all labs ordered are listed, but only abnormal results are displayed) Labs Reviewed  POC COVID19/FLU A&B COMBO - Normal    EKG   Radiology No results found.  Procedures Procedures (including critical care time)  Medications Ordered in UC Medications - No data to display  Initial Impression / Assessment and Plan / UC Course  I have reviewed the triage vital signs and the nursing notes.  Pertinent labs & imaging results that were available during my care of the patient were reviewed by me and considered in my medical decision making (see chart for details).  Acute URI  Patient is in no signs of distress nor toxic appearing.  Vital signs are stable.  Low suspicion for pneumonia, pneumothorax or bronchitis and therefore will defer imaging.  Open and flu test negative.  Prescribed azithromycin, prednisone and Tessalon.May use additional over-the-counter medications as needed for supportive care.  May follow-up with urgent care as needed if symptoms persist or worsen.   Final Clinical Impressions(s) / UC Diagnoses   Final diagnoses:  Acute URI     Discharge Instructions      Take azithromycin for bacterial coverage  Covid and  flu testing negative  Starting tomorrow to prednisone every morning with food as directed to open and relax airway, should help with breathing  May use Tessalon pill every 8 hours as needed for cough    You can take Tylenol and/or Ibuprofen as needed for fever reduction and pain relief.   For cough: honey 1/2 to 1 teaspoon (you can dilute the honey in water or another fluid).  You can also use guaifenesin and dextromethorphan for cough. You can use a humidifier for chest congestion and cough.  If you don't have a humidifier, you can sit in the bathroom with the hot shower running.      For sore throat: try warm salt water gargles, cepacol lozenges, throat spray, warm tea or water  with lemon/honey, popsicles or ice, or OTC cold relief medicine for throat discomfort.   For congestion: take a daily anti-histamine like Zyrtec, Claritin, and a oral decongestant, such as pseudoephedrine.  You can also use Flonase 1-2 sprays in each nostril daily.   It is important to stay hydrated: drink plenty of fluids (water, gatorade/powerade/pedialyte, juices, or teas) to keep your throat moisturized and help further relieve irritation/discomfort.    ED Prescriptions     Medication Sig Dispense Auth. Provider   predniSONE (STERAPRED UNI-PAK 21 TAB) 10 MG (21) TBPK tablet Take by mouth daily. Take 6 tabs by mouth daily  for 1 days, then 5 tabs for 1 days, then 4 tabs for 1 days, then 3 tabs for 1 days, 2 tabs for 1 days, then 1 tab by mouth daily for 1 days 21 tablet Ernest Popowski R, NP   benzonatate (TESSALON) 100 MG capsule Take 1 capsule (100 mg total) by mouth every 8 (eight) hours. 21 capsule Nani Ingram R, NP   azithromycin (ZITHROMAX) 250 MG tablet Take 1 tablet (250 mg total) by mouth daily. Take first 2 tablets together, then 1 every day until finished. 6 tablet Valinda Hoar, NP      PDMP not reviewed this encounter.   Valinda Hoar, [place] 07/17/23 [phone]

## 2023-12-04 ENCOUNTER — Emergency Department (HOSPITAL_COMMUNITY)

## 2023-12-04 ENCOUNTER — Emergency Department (HOSPITAL_COMMUNITY)
Admission: EM | Admit: 2023-12-04 | Discharge: 2023-12-05 | Disposition: A | Discharge status: Home / Self Care | Attending: Emergency Medicine | Admitting: Emergency Medicine

## 2023-12-04 ENCOUNTER — Encounter (HOSPITAL_COMMUNITY): Payer: Self-pay

## 2023-12-04 ENCOUNTER — Other Ambulatory Visit: Payer: Self-pay

## 2023-12-04 DIAGNOSIS — Z7951 Long term (current) use of inhaled steroids: Secondary | ICD-10-CM | POA: Insufficient documentation

## 2023-12-04 DIAGNOSIS — R112 Nausea with vomiting, unspecified: Secondary | ICD-10-CM | POA: Insufficient documentation

## 2023-12-04 DIAGNOSIS — R109 Unspecified abdominal pain: Secondary | ICD-10-CM | POA: Diagnosis not present

## 2023-12-04 DIAGNOSIS — R519 Headache, unspecified: Secondary | ICD-10-CM | POA: Insufficient documentation

## 2023-12-04 DIAGNOSIS — Z79899 Other long term (current) drug therapy: Secondary | ICD-10-CM | POA: Diagnosis not present

## 2023-12-04 DIAGNOSIS — J45909 Unspecified asthma, uncomplicated: Secondary | ICD-10-CM | POA: Diagnosis not present

## 2023-12-04 LAB — COMPREHENSIVE METABOLIC PANEL WITH GFR
ALT: 19 U/L (ref 0–44)
AST: 23 U/L (ref 15–41)
Albumin: 3.8 g/dL (ref 3.5–5.0)
Alkaline Phosphatase: 72 U/L (ref 38–126)
Anion gap: 10 (ref 5–15)
BUN: 7 mg/dL (ref 6–20)
CO2: 26 mmol/L (ref 22–32)
Calcium: 9.9 mg/dL (ref 8.9–10.3)
Chloride: 103 mmol/L (ref 98–111)
Creatinine, Ser: 0.88 mg/dL (ref 0.44–1.00)
GFR, Estimated: >60 mL/min (ref >60)
Glucose, Bld: 122 mg/dL — ABNORMAL HIGH (ref 70–99)
Potassium: 4.1 mmol/L (ref 3.5–5.1)
Sodium: 139 mmol/L (ref 135–145)
Total Bilirubin: 0.6 mg/dL (ref 0.0–1.2)
Total Protein: 7.7 g/dL (ref 6.5–8.1)

## 2023-12-04 LAB — CBC
HCT: 41.1 % (ref 36.0–46.0)
Hemoglobin: 12.5 g/dL (ref 12.0–15.0)
MCH: 23.3 pg — ABNORMAL LOW (ref 26.0–34.0)
MCHC: 30.4 g/dL (ref 30.0–36.0)
MCV: 76.5 fL — ABNORMAL LOW (ref 80.0–100.0)
Platelets: 335 K/uL (ref 150–400)
RBC: 5.37 MIL/uL — ABNORMAL HIGH (ref 3.87–5.11)
RDW: 19.4 % — ABNORMAL HIGH (ref 11.5–15.5)
WBC: 7.6 K/uL (ref 4.0–10.5)
nRBC: 0 % (ref 0.0–0.2)

## 2023-12-04 LAB — LIPASE, BLOOD: Lipase: 32 U/L (ref 11–51)

## 2023-12-04 LAB — HCG, SERUM, QUALITATIVE: Preg, Serum: NEGATIVE

## 2023-12-04 NOTE — ED Provider Triage Note (Signed)
 Emergency Medicine Provider Triage Evaluation Note  Heather Harding , a 47 y.o. female  was evaluated in triage.  Pt complains of migraine, nausea, abdominal pain, diarrhea, shortness of breath. Denies vomiting, fever, chills. These symptoms have been present for one day. Denies urinary symptoms or hematuria. Denies hematochezia.  Has history of migraines, denies acute visual disturbances however does appreciate some blurry vision which has been like that for some time.  Review of Systems  Positive: Migraine, nausea, abdominal pain, diarrhea, shortness of breath Negative: Vomiting, fever, chills  Physical Exam  BP (!) 162/89 (BP Location: Left Arm)   Pulse 91   Temp 98.2 F (36.8 C)   Resp 14   Ht 5' 5 (1.651 m)   Wt 124.7 kg   SpO2 99%   BMI 45.76 kg/m  Gen:   Awake, no distress   Resp:  Normal effort  MSK:   Moves extremities without difficulty  Other:  Abdomen soft, nontender to palpation, no CVA tenderness, no rebound or guarding, patient not in acute distress, vital signs stable, patient talking in full sentences on room air  Medical Decision Making  Medically screening exam initiated at 6:54 PM.  Appropriate orders placed.  Heather Harding was informed that the remainder of the evaluation will be completed by another provider, this initial triage assessment does not replace that evaluation, and the importance of remaining in the ED until their evaluation is complete.  Orders: CBC, CMP, UA, lipase, chest x-ray, serum qualitative   Heather Harding, [place] 12/04/23 1902

## 2023-12-04 NOTE — ED Triage Notes (Signed)
 Pt c/o migraine, generalized weakness, nausea, lower abd pain, and diarrhea x 1 day; states she thinks she has had fevers; denies urinary issues

## 2023-12-05 MED ORDER — ALBUTEROL SULFATE (2.5 MG/3ML) 0.083% IN NEBU
2.5000 mg | INHALATION_SOLUTION | Freq: Once | RESPIRATORY_TRACT | Status: AC
  Administered 2023-12-05: 2.5 mg via RESPIRATORY_TRACT
  Filled 2023-12-05: qty 3

## 2023-12-05 MED ORDER — SODIUM CHLORIDE 0.9 % IV BOLUS
1000.0000 mL | Freq: Once | INTRAVENOUS | Status: AC
Start: 2023-12-05 — End: 2023-12-05
  Administered 2023-12-05: 1000 mL via INTRAVENOUS

## 2023-12-05 MED ORDER — DIPHENHYDRAMINE HCL 50 MG/ML IJ SOLN
25.0000 mg | Freq: Once | INTRAMUSCULAR | Status: AC
  Administered 2023-12-05: 25 mg via INTRAVENOUS
  Filled 2023-12-05: qty 1

## 2023-12-05 MED ORDER — PROCHLORPERAZINE EDISYLATE 10 MG/2ML IJ SOLN: 10.0000 mg | Freq: Once | INTRAMUSCULAR | Status: DC

## 2023-12-05 MED ORDER — KETOROLAC TROMETHAMINE 15 MG/ML IJ SOLN
15.0000 mg | Freq: Once | INTRAMUSCULAR | Status: AC
  Administered 2023-12-05: 15 mg via INTRAVENOUS
  Filled 2023-12-05: qty 1

## 2023-12-05 NOTE — ED Notes (Signed)
 Awaiting patient from lobby.

## 2023-12-05 NOTE — Discharge Instructions (Signed)
 Your laboratory results are within normal limits today.  You may schedule an appointment with a neurologist in order to obtain further management of your headaches.  You may also purchase some over-the-counter naproxen to help with headaches as well.

## 2023-12-05 NOTE — ED Provider Notes (Signed)
 [place] EMERGENCY DEPARTMENT AT [facility] Provider Note   CSN: [id number] Arrival date & time: 12/04/23  1613     Patient presents with: Abdominal Pain, Emesis, and Migraine   Heather Harding is a 48 y.o. female.   48 y.o female with a PMH of Asthma since the ED with a chief complaint of headache, abdominal pain, nausea for the past 24 hours.  Patient endorses prior history of migraines, usually resolve with ibuprofen or Tylenol .  States that yesterday she felt a generalized headache, which also made her feel very nauseous, tried some of her over-the-counter medication without any improvement in symptoms.  Then she told me she developed some abdominal pain.  According to mother at the bedside, whenever she has a menstrual cycle, this is usually accompanied by stomach pain along with headaches.  She tells me now that while she was in the waiting room all her symptoms have subsided but the headache continues to linger.  Patient is being evaluated by me after 16-1/2 hours in the waiting room.  She denies any vomiting, last oral intake was sometime yesterday.  She does endorse 1 episode of nonbloody diarrhea.  Also states that she was supposed to have a breathing treatment overnight, however due to her waiting in the lobby she was unable to do so.  Denies any fever, no cough, no changes in vision, no focal weakness.  The history is provided by the patient.  Abdominal Pain Associated symptoms: nausea and vomiting   Associated symptoms: no chest pain, no chills, no fever, no shortness of breath and no sore throat   Emesis Associated symptoms: abdominal pain   Associated symptoms: no chills, no fever and no sore throat   Migraine Associated symptoms include abdominal pain. Pertinent negatives include no chest pain and no shortness of breath.       Prior to Admission medications   Medication Sig Start Date End Date Taking? Authorizing Provider  acetaminophen  (TYLENOL ) 500 MG  tablet Take 1,000 mg by mouth every 6 (six) hours as needed for fever, headache or mild pain (pain score 1-3) (WIll take 3 tablets if it is a migraine).    [provider]  albuterol  (VENTOLIN  HFA) 108 (90 Base) MCG/ACT inhaler Inhale 1-2 puffs into the lungs every 6 (six) hours as needed for wheezing or shortness of breath. 05/04/21   Tonette Lauraine HERO, PA-C  azithromycin  (ZITHROMAX ) 250 MG tablet Take 1 tablet (250 mg total) by mouth daily. Take first 2 tablets together, then 1 every day until finished. 07/16/23   White, Shelba SAUNDERS, NP  benzonatate  (TESSALON ) 100 MG capsule Take 1 capsule (100 mg total) by mouth every 8 (eight) hours. 07/16/23   White, Shelba SAUNDERS, NP  budesonide -formoterol  (SYMBICORT ) 160-4.5 MCG/ACT inhaler Inhale 2 puffs into the lungs daily. 05/12/23   [provider]  Cetirizine  HCl (ZYRTEC  ALLERGY) 10 MG CAPS Take 1 capsule (10 mg total) by mouth daily. 08/22/17   Babara, Amy V, PA-C  cetirizine -pseudoephedrine (ZYRTEC -D) 5-120 MG tablet Take 1 tablet by mouth 2 (two) times daily.    [provider]  cholecalciferol (VITAMIN D3) 10 MCG (400 UNIT) TABS tablet Take 400 Units by mouth daily.    [provider]  Cyanocobalamin (VITAMIN B12 PO) Take 1 tablet by mouth daily at 12 noon.    [provider]  guaiFENesin -dextromethorphan  (ROBITUSSIN DM) 100-10 MG/5ML syrup Take 5 mLs by mouth every 4 (four) hours as needed for cough. 06/07/23   Pahwani, Ravi,  MD  omeprazole (PRILOSEC) 20 MG capsule Take 20 mg by mouth daily.    [provider]  predniSONE  (STERAPRED UNI-PAK 21 TAB) 10 MG (21) TBPK tablet Take by mouth daily. Take 6 tabs by mouth daily  for 1 days, then 5 tabs for 1 days, then 4 tabs for 1 days, then 3 tabs for 1 days, 2 tabs for 1 days, then 1 tab by mouth daily for 1 days 07/16/23   Teresa Shelba SAUNDERS, NP    Allergies: Aspirin, Iodine, Morphine, Nsaids, Ranitidine, Advair hfa [fluticasone -salmeterol], Codeine, and Morphine and  codeine    Review of Systems  Constitutional:  Negative for chills and fever.  HENT:  Negative for sore throat.   Respiratory:  Negative for shortness of breath.   Cardiovascular:  Negative for chest pain.  Gastrointestinal:  Positive for abdominal pain, nausea and vomiting.  Genitourinary:  Negative for flank pain.  Musculoskeletal:  Negative for back pain.  All other systems reviewed and are negative.   Updated Vital Signs BP 123/83   Pulse 89   Temp 98.7 F (37.1 C) (Oral)   Resp 15   Ht 5' 5 (1.651 m)   Wt 124.7 kg   SpO2 100%   BMI 45.76 kg/m   Physical Exam Vitals and nursing note reviewed.  Constitutional:      Appearance: She is well-developed.  HENT:     Head: Normocephalic and atraumatic.  Cardiovascular:     Rate and Rhythm: Normal rate.  Pulmonary:     Effort: Pulmonary effort is normal.     Breath sounds: No wheezing or rales.  Abdominal:     General: Abdomen is flat. Bowel sounds are normal.     Palpations: Abdomen is soft.     Tenderness: There is no abdominal tenderness. There is no right CVA tenderness or left CVA tenderness.  Skin:    General: Skin is warm and dry.  Neurological:     Mental Status: She is alert and oriented to person, place, and time.     (all labs ordered are listed, but only abnormal results are displayed) Labs Reviewed  COMPREHENSIVE METABOLIC PANEL WITH GFR - Abnormal; Notable for the following components:      Result Value   Glucose, Bld 122 (*)    All other components within normal limits  CBC - Abnormal; Notable for the following components:   RBC 5.37 (*)    MCV 76.5 (*)    MCH 23.3 (*)    RDW 19.4 (*)    All other components within normal limits  LIPASE, BLOOD  HCG, SERUM, QUALITATIVE  URINALYSIS, ROUTINE W REFLEX MICROSCOPIC    EKG: None  Radiology: DG Chest 2 View Result Date: 12/04/2023 CLINICAL DATA:  Shortness of breath. Weakness, nausea, lower abdominal pain, and diarrhea. Fevers. EXAM: CHEST - 2  VIEW COMPARISON:  06/03/2023 FINDINGS: Slightly shallow inspiration. Heart size and pulmonary vascularity are normal for technique. Mediastinum shifts towards the right, unchanged since previous studies. This is historically congenital. No airspace disease or consolidation demonstrated in the lungs. No pleural effusion or pneumothorax. Mediastinal contours appear intact. IMPRESSION: Chronic mediastinal shift towards the right, unchanged since prior study. No evidence of active pulmonary disease. Electronically Signed   By: Elsie Gravely M.D.   On: 12/04/2023 19:36     Procedures   Medications Ordered in the ED  prochlorperazine  (COMPAZINE ) injection 10 mg (0 mg Intravenous Hold 12/05/23 0908)  albuterol  (PROVENTIL ) (2.5 MG/3ML) 0.083% nebulizer solution 2.5 mg (  2.5 mg Nebulization Given 12/05/23 0920)  sodium chloride  0.9 % bolus 1,000 mL (0 mLs Intravenous Stopped 12/05/23 1044)  diphenhydrAMINE  (BENADRYL ) injection 25 mg (25 mg Intravenous Given 12/05/23 0921)  ketorolac  (TORADOL ) 15 MG/ML injection 15 mg (15 mg Intravenous Given 12/05/23 1044)                                    Medical Decision Making Amount and/or Complexity of Data Reviewed Labs: ordered.  Risk Prescription drug management.    This patient presents to the ED for concern of headache, abdominal pain, this involves a number of treatment options, and is a complaint that carries with it a high risk of complications and morbidity.  The differential diagnosis includes pregnancy, complex migraine, versus viral illness.   Co morbidities: Discussed in HPI   Brief History:  See HPI  EMR reviewed including pt PMHx, past surgical history and past visits to ER.   See HPI for more details  Lab Tests:  I ordered and independently interpreted labs.  The pertinent results include:    I personally reviewed all laboratory work and imaging. Metabolic panel without any acute abnormality specifically kidney function within  normal limits and no significant electrolyte abnormalities. CBC without leukocytosis or significant anemia. HCG negative.   Imaging Studies:  DG Chest showed: IMPRESSION:  Chronic mediastinal shift towards the right, unchanged since prior  study. No evidence of active pulmonary disease.    Medicines ordered:  I ordered medication including benadryl , compazine , bolus  for symptomatic treatment  Reevaluation of the patient after these medicines showed that the patient improved I have reviewed the patients home medicines and have made adjustments as needed  Reevaluation:  After the interventions noted above I re-evaluated patient and found that they have :improved  Social Determinants of Health:  The patient's social determinants of health were a factor in the care of this patient  Problem List / ED Course:  Patient presented to the ED with multiple complaint such as headache, abdominal pain, nausea all symptoms began yesterday.  Mother at the bedside reports that patient usually suffers from migraines around her menstrual cycle.  She has taken some over-the-counter medication such as ibuprofen to help with her headache without much improvement in symptoms.  Patient did not eat anything yesterday, according to her patient she feels that she has hunger pains .  Blood work here has been unremarkable, slight ovation in his creatinine, does not report much oral intake yesterday.  She is not having any urinary symptoms at this time.  Her migraine feels like prior migraines. Chest x-ray is within normal limits.  She did request a nebulizer treatment here.  Given headache cocktail such as Benadryl , Compazine  which she refuses she reports she is no longer nauseated.  Also given some fluids to help with hydration. 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  Will proceed with Toradol .  Patient reevaluated after receiving Toradol  reports resolution of her headache and would like to assess prescription.  I discussed with her I cannot send her home on Toradol , but I can give her a referral for neurology to help with ongoing headaches. She is agreeable to plan treatment as time, return precautions discussed at length.  Patient is  hemodynamically stable for discharge.  Dispostion:  After consideration of the diagnostic results and the patients response to treatment, I feel that the patent would benefit from close follow-up with neurology.    Portions of this note were generated with Scientist, clinical (histocompatibility and immunogenetics). Dictation errors may occur despite best attempts at proofreading.     Final diagnoses:  Nonintractable headache, unspecified chronicity pattern, unspecified headache type    ED Discharge Orders     None          Heather Broad, PA-C 12/05/23 1206    Heather Harding LABOR, DO 12/15/23 1645

## 2023-12-05 NOTE — ED Notes (Signed)
 Pt states she needs to take asthma medication. Asking if we can give her a breathing tx here. Triage nurse Scarlett aware

## 2024-03-24 ENCOUNTER — Ambulatory Visit
Admission: EM | Admit: 2024-03-24 | Discharge: 2024-03-24 | Disposition: A | Discharge status: Home / Self Care | Attending: Emergency Medicine | Admitting: Emergency Medicine

## 2024-03-24 ENCOUNTER — Encounter: Payer: Self-pay | Admitting: Emergency Medicine

## 2024-03-24 DIAGNOSIS — J069 Acute upper respiratory infection, unspecified: Secondary | ICD-10-CM

## 2024-03-24 DIAGNOSIS — J4521 Mild intermittent asthma with (acute) exacerbation: Secondary | ICD-10-CM | POA: Diagnosis not present

## 2024-03-24 MED ORDER — AZITHROMYCIN 250 MG PO TABS: 250.0000 mg | ORAL_TABLET | Freq: Every day | ORAL | 0 refills | Status: AC

## 2024-03-24 NOTE — Discharge Instructions (Addendum)
 Begin antibiotics as directed to get rid of any bacteria causing symptoms to linger  Continue taking inhaler for management of shortness of breath and wheezing, if your symptoms continue to persist please follow-up with your primary doctor for further evaluation and management as you may need oral steroid  You can take Tylenol  and/or Ibuprofen as needed for fever reduction and pain relief.   For cough: honey 1/2 to 1 teaspoon (you can dilute the honey in water or another fluid).  You can also use guaifenesin  and dextromethorphan  for cough. You can use a humidifier for chest congestion and cough.  If you don't have a humidifier, you can sit in the bathroom with the hot shower running.      For sore throat: try warm salt water gargles, cepacol lozenges, throat spray, warm tea or water with lemon/honey, popsicles or ice, or OTC cold relief medicine for throat discomfort.   For congestion: take a daily anti-histamine like Zyrtec , Claritin, and a oral decongestant, such as pseudoephedrine.  You can also use Flonase  1-2 sprays in each nostril daily.   It is important to stay hydrated: drink plenty of fluids (water, gatorade/powerade/pedialyte, juices, or teas) to keep your throat moisturized and help further relieve irritation/discomfort.

## 2024-03-24 NOTE — ED Provider Notes (Signed)
 Heather Harding    CSN: [id number] Arrival date & time: 03/24/24  9075      History   Chief Complaint Chief Complaint  Patient presents with   Cough   Diarrhea    HPI Heather Harding is a 48 y.o. female.   Patient presents for evaluation of rhinorrhea, nonproductive cough, shortness of breath with coughing and intermittent wheezing present for 7 days.  Had 2 occurrences of diarrhea this morning.  Has experienced bilateral ear itching but denies drainage, pain or fullness.  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  Tolerable to food and liquids.  Initially thought to be allergies and attempted Zyrtec , also has attempted albuterol  inhaler, history of asthma.    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 07/05/2006   Asthma 07/05/2006   GASTROESOPHAGEAL REFLUX, NO ESOPHAGITIS 07/05/2006    History reviewed. No pertinent surgical history.  OB History     Gravida  1   Para  0   Term      Preterm      AB  1   Living  0      SAB      IAB  1   Ectopic      Multiple      Live Births  0            Home Medications    Prior to Admission medications   Medication Sig Start Date End Date Taking? Authorizing Provider  acetaminophen  (TYLENOL ) 500 MG tablet Take 1,000 mg by mouth every 6 (six) hours as needed for fever, headache or mild pain (pain score 1-3) (WIll take 3 tablets if it is a migraine).    [provider]  albuterol  (VENTOLIN  HFA)  108 (90 Base) MCG/ACT inhaler Inhale 1-2 puffs into the lungs every 6 (six) hours as needed for wheezing or shortness of breath. 05/04/21   Tonette Lauraine HERO, PA-C  azithromycin  (ZITHROMAX ) 250 MG tablet Take 1 tablet (250 mg total) by mouth daily. Take first 2 tablets together, then 1 every day until finished. 07/16/23   Telissa Palmisano, Shelba SAUNDERS, NP  benzonatate  (TESSALON ) 100 MG capsule Take 1 capsule (100 mg total) by mouth every 8 (eight) hours. 07/16/23   Lamika Connolly, Shelba SAUNDERS, NP  budesonide -formoterol  (SYMBICORT ) 160-4.5 MCG/ACT inhaler Inhale 2 puffs into the lungs daily. 05/12/23   [provider]  Cetirizine  HCl (ZYRTEC  ALLERGY) 10 MG CAPS Take 1 capsule (10 mg total) by mouth daily. 08/22/17   Babara, Amy V, PA-C  cetirizine -pseudoephedrine (ZYRTEC -D) 5-120 MG tablet Take 1 tablet by mouth 2 (two) times daily.    [provider]  cholecalciferol (VITAMIN D3) 10 MCG (400 UNIT) TABS tablet Take 400 Units by mouth daily.    [provider]  Cyanocobalamin (VITAMIN B12 PO) Take 1 tablet by mouth daily at 12 noon.    [provider]  guaiFENesin -dextromethorphan  (ROBITUSSIN DM) 100-10 MG/5ML syrup Take 5 mLs by mouth  every 4 (four) hours as needed for cough. 06/07/23   Pahwani, Ravi, MD  omeprazole (PRILOSEC) 20 MG capsule Take 20 mg by mouth daily.    [provider]  predniSONE  (STERAPRED UNI-PAK 21 TAB) 10 MG (21) TBPK tablet Take by mouth daily. Take 6 tabs by mouth daily  for 1 days, then 5 tabs for 1 days, then 4 tabs for 1 days, then 3 tabs for 1 days, 2 tabs for 1 days, then 1 tab by mouth daily for 1 days 07/16/23   Teresa Shelba SAUNDERS, NP    Family History Family History  Problem Relation Age of Onset   Cancer Paternal Grandfather    Stroke Maternal Grandmother    Stroke Maternal Grandfather    Diabetes Father    Hypertension Sister     Social History Social History   Tobacco Use   Smoking status: Never   Smokeless tobacco: Never  Vaping Use    Vaping status: Never Used  Substance Use Topics   Alcohol use: No   Drug use: No     Allergies   Aspirin, Iodine, Morphine, Nsaids, Ranitidine, Advair hfa [fluticasone -salmeterol], Codeine, and Morphine and codeine   Review of Systems Review of Systems  Constitutional: Negative.   HENT:  Positive for rhinorrhea. Negative for congestion, dental problem, drooling, ear discharge, ear pain, facial swelling, hearing loss, mouth sores, nosebleeds, postnasal drip, sinus pressure, sinus pain, sneezing, sore throat, tinnitus, trouble swallowing and voice change.   Respiratory:  Positive for cough, shortness of breath and wheezing. Negative for apnea, choking, chest tightness and stridor.   Gastrointestinal:  Positive for diarrhea. Negative for abdominal distention, abdominal pain, anal bleeding, blood in stool, constipation, nausea, rectal pain and vomiting.     Physical Exam Triage Vital Signs ED Triage Vitals  Encounter Vitals Group     BP 03/24/24 0934 (!) 150/83     Girls Systolic BP Percentile --      Girls Diastolic BP Percentile --      Boys Systolic BP Percentile --      Boys Diastolic BP Percentile --      Pulse Rate 03/24/24 0934 93     Resp 03/24/24 0934 20     Temp 03/24/24 0934 98.4 F (36.9 C)     Temp Source 03/24/24 0934 Oral     SpO2 03/24/24 0934 98 %     Weight --      Height --      Head Circumference --      Peak Flow --      Pain Score 03/24/24 0937 0     Pain Loc --      Pain Education --      Exclude from Growth Chart --    No data found.  Updated Vital Signs BP (!) 150/83 (BP Location: Left Arm)   Pulse 93   Temp 98.4 F (36.9 C) (Oral)   Resp 20   SpO2 98%   Visual Acuity Right Eye Distance:   Left Eye Distance:   Bilateral Distance:    Right Eye Near:   Left Eye Near:    Bilateral Near:     Physical Exam Constitutional:      Appearance: Normal appearance.  HENT:     Right Ear: Tympanic membrane, ear canal and external ear normal.      Left Ear: Tympanic membrane, ear canal and external ear normal.     Nose: Congestion present.     Mouth/Throat:  Mouth: Mucous membranes are moist.     Pharynx: Oropharynx is clear. No oropharyngeal exudate or posterior oropharyngeal erythema.  Eyes:     Extraocular Movements: Extraocular movements intact.  Cardiovascular:     Rate and Rhythm: Normal rate and regular rhythm.     Pulses: Normal pulses.     Heart sounds: Normal heart sounds.  Pulmonary:     Effort: Pulmonary effort is normal.     Breath sounds: Normal breath sounds.  Neurological:     Mental Status: She is alert and oriented to person, place, and time. Mental status is at baseline.      UC Treatments / Results  Labs (all labs ordered are listed, but only abnormal results are displayed) Labs Reviewed - No data to display  EKG   Radiology No results found.  Procedures Procedures (including critical care time)  Medications Ordered in UC Medications - No data to display  Initial Impression / Assessment and Plan / UC Course  I have reviewed the triage vital signs and the nursing notes.  Pertinent labs & imaging results that were available during my care of the patient were reviewed by me and considered in my medical decision making (see chart for details).  Acute URI, mild intermittent asthma with acute exacerbation  Patient is in no signs of distress nor toxic appearing.  Vital signs are stable.  Low suspicion for pneumonia, pneumothorax or bronchitis and therefore will defer imaging.  Symptoms present for 7 days, affecting breathing, prescribed azithromycin , declined prescription for steroids and cough syrup.  Instructed that if shortness of breath and wheezing persist that she will need reevaluation for additional medications. May use additional over-the-counter medications as needed for supportive care.  May follow-up with urgent care as needed if symptoms persist or worsen.  Note given.   Final  Clinical Impressions(s) / UC Diagnoses   Final diagnoses:  None   Discharge Instructions   None    ED Prescriptions   None    PDMP not reviewed this encounter.   Teresa Shelba SAUNDERS, NP 03/24/24 1050

## 2024-03-24 NOTE — ED Triage Notes (Signed)
 Patient reports, cough, diarrhea and sneezing  x 1 week. Patient has taken Zyrtec , and neb treatments with mild relief. Patient has had 2 episodes of diarrhea today. Denies pain.
